# Patient Record
Sex: Female | Born: 1975 | Race: White | Hispanic: No | Marital: Married | Smoking: Former smoker
Health system: Southern US, Community
[De-identification: ages and names within clinical notes are randomized; demographics above are authoritative.]

## PROBLEM LIST (undated history)

## (undated) ENCOUNTER — Inpatient Hospital Stay (HOSPITAL_COMMUNITY): Payer: Self-pay

## (undated) DIAGNOSIS — F419 Anxiety disorder, unspecified: Secondary | ICD-10-CM

## (undated) DIAGNOSIS — M26609 Unspecified temporomandibular joint disorder, unspecified side: Secondary | ICD-10-CM

## (undated) DIAGNOSIS — O3440 Maternal care for other abnormalities of cervix, unspecified trimester: Secondary | ICD-10-CM

## (undated) DIAGNOSIS — Z9889 Other specified postprocedural states: Secondary | ICD-10-CM

## (undated) DIAGNOSIS — IMO0002 Reserved for concepts with insufficient information to code with codable children: Secondary | ICD-10-CM

## (undated) DIAGNOSIS — Q513 Bicornate uterus: Secondary | ICD-10-CM

## (undated) HISTORY — DX: Maternal care for other abnormalities of cervix, unspecified trimester: O34.40

## (undated) HISTORY — DX: Other specified postprocedural states: Z98.890

## (undated) HISTORY — DX: Unspecified temporomandibular joint disorder, unspecified side: M26.609

---

## 1998-01-31 ENCOUNTER — Other Ambulatory Visit: Admission: RE | Admit: 1998-01-31 | Discharge: 1998-01-31 | Payer: Self-pay | Admitting: Dermatology

## 1998-06-19 ENCOUNTER — Other Ambulatory Visit: Admission: RE | Admit: 1998-06-19 | Discharge: 1998-06-19 | Payer: Self-pay | Admitting: Obstetrics & Gynecology

## 1999-07-31 ENCOUNTER — Other Ambulatory Visit: Admission: RE | Admit: 1999-07-31 | Discharge: 1999-07-31 | Payer: Self-pay | Admitting: Obstetrics and Gynecology

## 2000-09-29 ENCOUNTER — Other Ambulatory Visit: Admission: RE | Admit: 2000-09-29 | Discharge: 2000-09-29 | Payer: Self-pay | Admitting: Obstetrics and Gynecology

## 2002-04-04 ENCOUNTER — Emergency Department (HOSPITAL_COMMUNITY): Admission: EM | Admit: 2002-04-04 | Discharge: 2002-04-04 | Payer: Self-pay | Admitting: Emergency Medicine

## 2004-11-28 ENCOUNTER — Encounter: Admission: RE | Admit: 2004-11-28 | Discharge: 2004-11-28 | Payer: Self-pay | Admitting: Obstetrics & Gynecology

## 2005-01-15 ENCOUNTER — Encounter: Admission: RE | Admit: 2005-01-15 | Discharge: 2005-01-15 | Payer: Self-pay | Admitting: Obstetrics & Gynecology

## 2006-11-22 IMAGING — US US TRANSVAGINAL NON-OB
1 series · 13 of 25 positions shown · non-contrast
Comparison: none

CLINICAL DATA: Right adnexal mass felt on physical exam with pelvic pain. 
TRANSABDOMINAL/TRANSVAGINAL PELVIC ULTRASOUND: 
Slight cul-de-sac free fluid is seen with ovoid central echogenic material consistent with part of mesentery measuring up to 1.5cm within the free fluid.  Alternatively focal debris such as clotted blood is possible.  Bilateral ovaries are normal in size with the left measuring 3.7cm long x 3.4cm AP x 3.9cm wide with central slightly complex cyst containing likely benign thin septation with this cyst measuring 3.2cm long x 2.7cm AP x 3.3cm wide.  The right ovary measures 2.9cm long x 1.9cm AP x 3cm wide containing a slightly complex cyst measuring up to 1.7cm.  Findings consistent with bicornuate uterus are noted with the left fundal endometrial stripe thickness measuring normally at 5mm and the right at 3mm.  The uterus is normal in size measuring 8.5cm long x 3cm AP x 5.1cm wide.

[Series 1: unknown · 0.22mm/px · 13 of 55 slices shown]
[im 1/55]
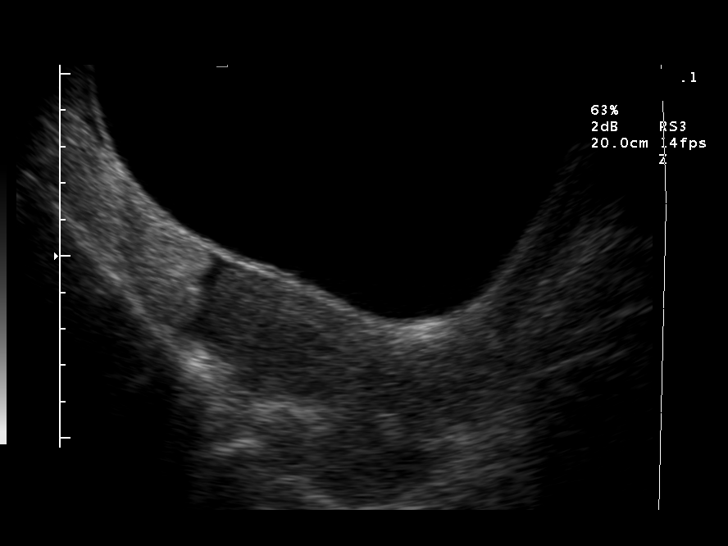
[im 5/55]
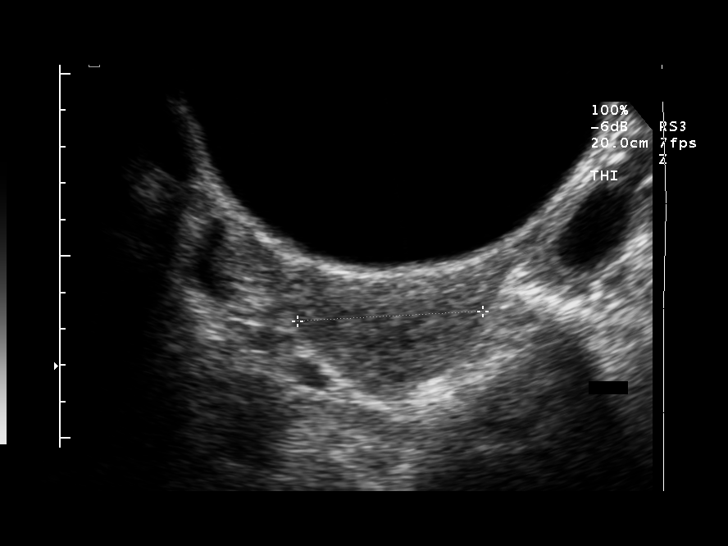
[im 10/55]
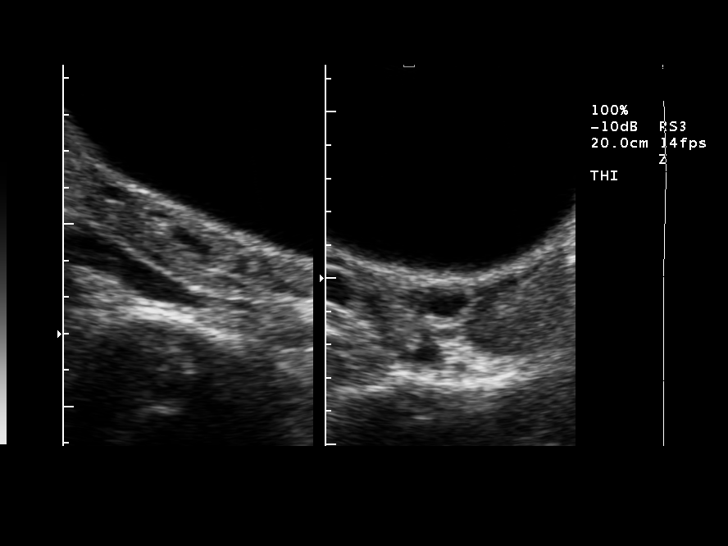
[im 14/55]
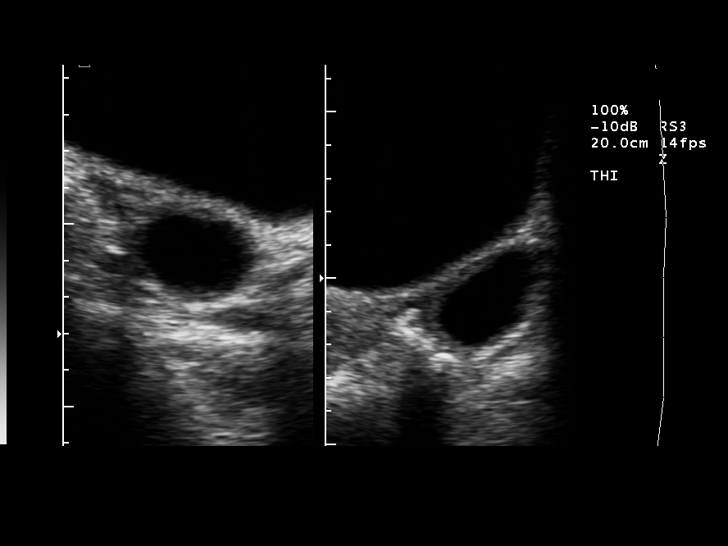
[im 19/55]
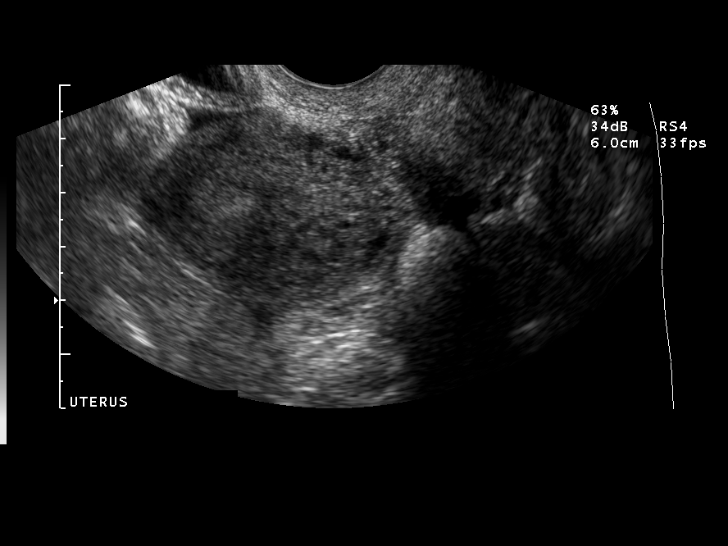
[im 23/55]
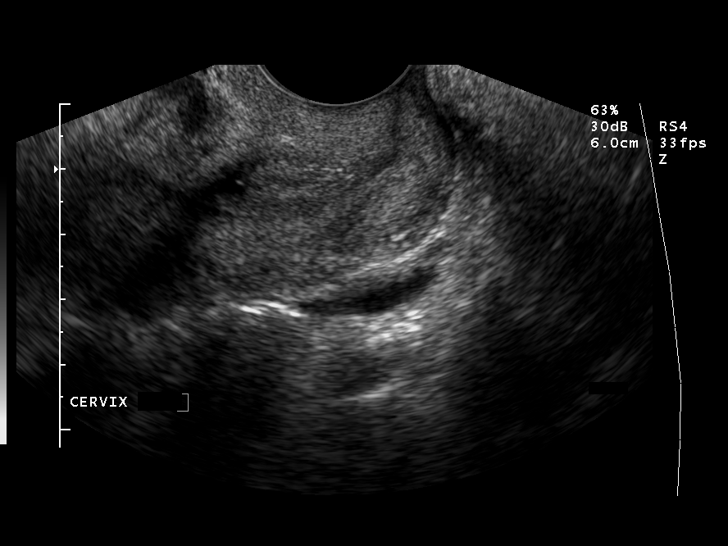
[im 28/55]
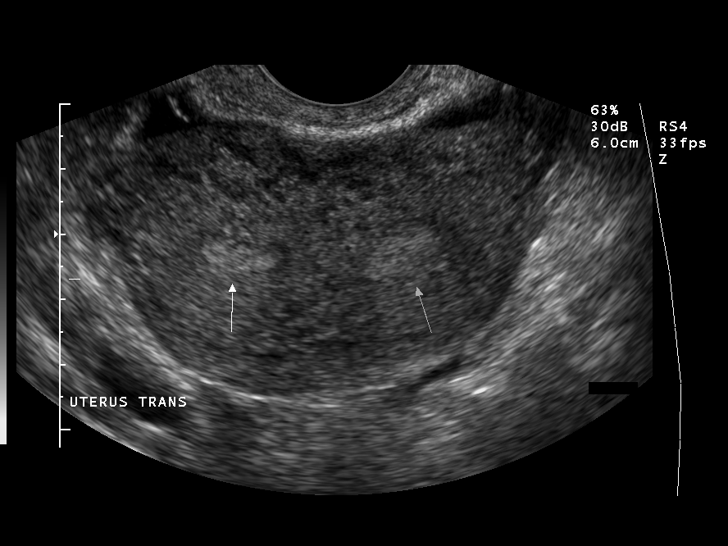
[im 32/55]
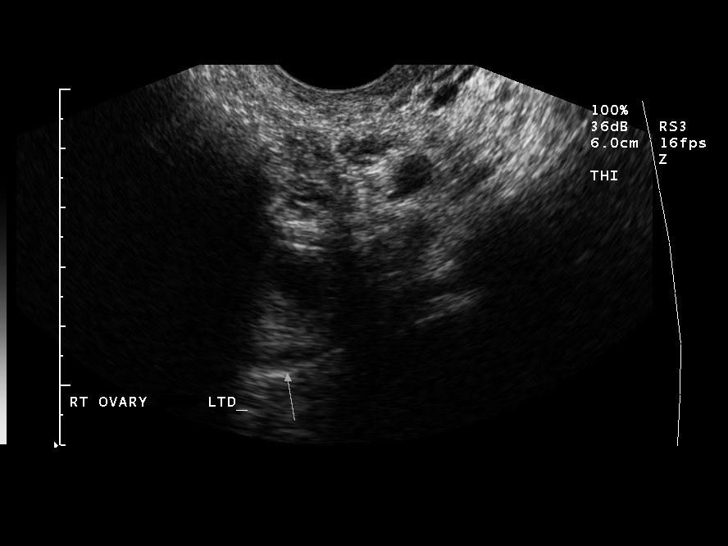
[im 37/55]
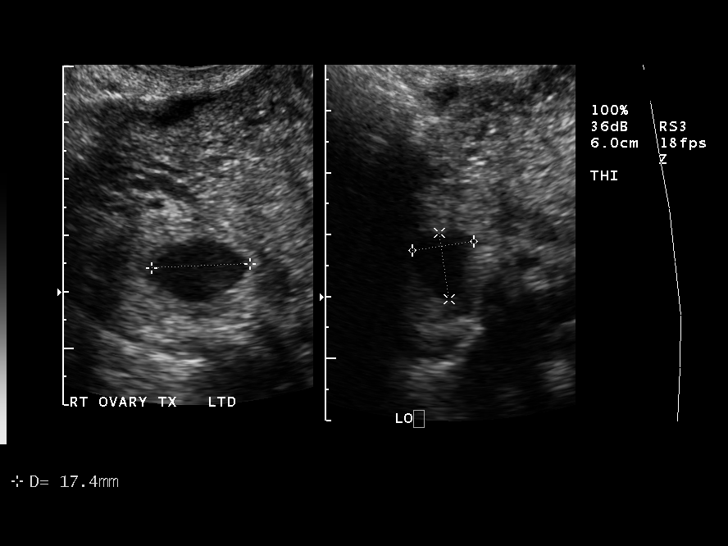
[im 41/55]
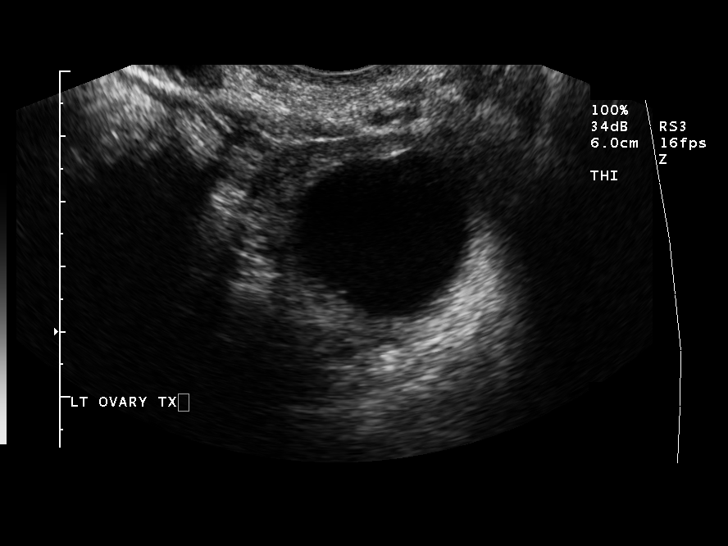
[im 46/55]
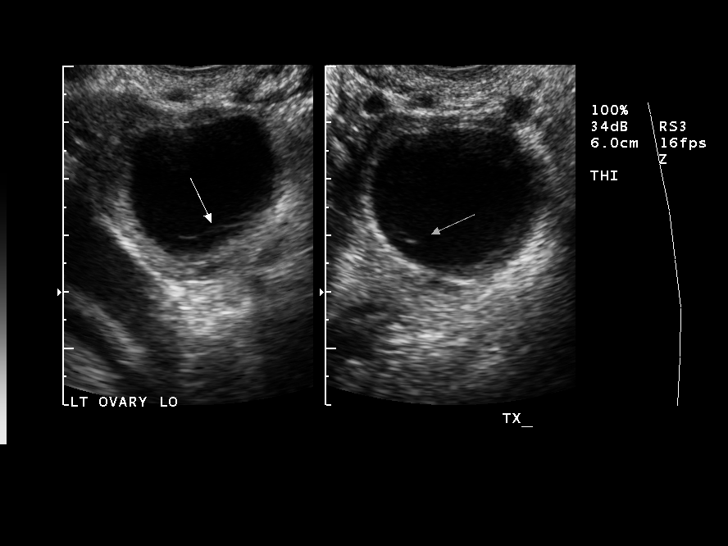
[im 50/55]
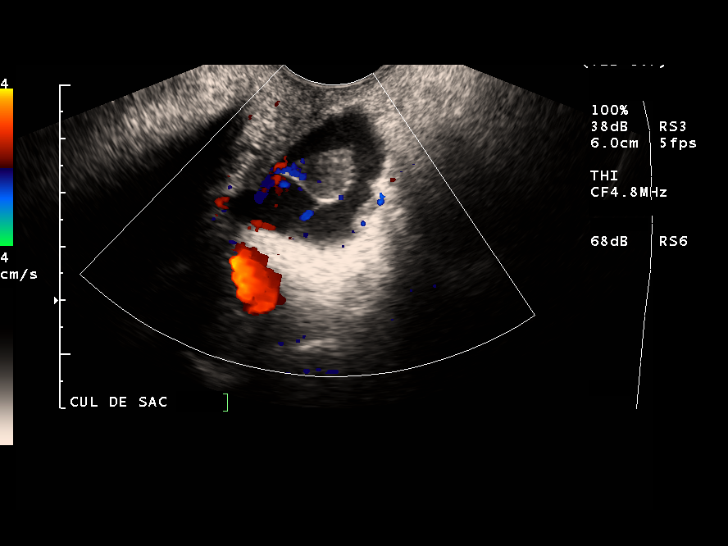
[im 55/55]
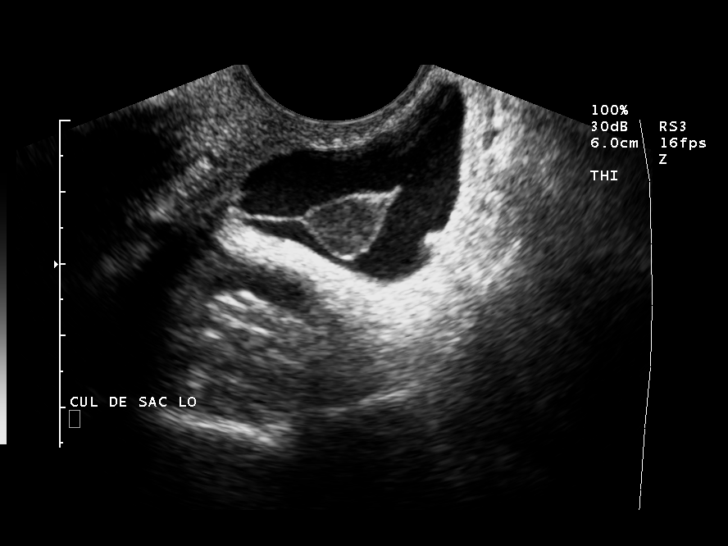

[13 of 25 positions shown; findings below may reference images not displayed]

IMPRESSION: 1.  Slight cul-de-sac free fluid suggesting ruptured or leaking ovarian cyst.  Central echogenic material is consistent with focus of mesenty or slight clotted blood. 
2.  Slightly complex bilateral ovarian cysts likely benign features with the largest at the left measuring up to 3.3cm.  
3.  Findings consistent with developmental variant bicornuate uterus. 
4.  Otherwise negative.

## 2011-06-17 LAB — RUBELLA ANTIBODY, IGM: Rubella: IMMUNE

## 2011-06-17 LAB — RPR: RPR: NONREACTIVE

## 2011-07-20 ENCOUNTER — Encounter (HOSPITAL_COMMUNITY): Payer: Self-pay | Admitting: *Deleted

## 2011-12-03 ENCOUNTER — Encounter (HOSPITAL_COMMUNITY): Payer: Self-pay | Admitting: *Deleted

## 2011-12-03 ENCOUNTER — Inpatient Hospital Stay (HOSPITAL_COMMUNITY)
Admission: AD | Admit: 2011-12-03 | Discharge: 2011-12-08 | DRG: 765 | Disposition: A | Discharge status: Home / Self Care | Payer: 59 | Source: Ambulatory Visit | Attending: Obstetrics and Gynecology | Admitting: Obstetrics and Gynecology

## 2011-12-03 ENCOUNTER — Inpatient Hospital Stay (HOSPITAL_COMMUNITY): Payer: 59

## 2011-12-03 DIAGNOSIS — O3400 Maternal care for unspecified congenital malformation of uterus, unspecified trimester: Secondary | ICD-10-CM | POA: Diagnosis present

## 2011-12-03 DIAGNOSIS — O321XX0 Maternal care for breech presentation, not applicable or unspecified: Secondary | ICD-10-CM | POA: Diagnosis present

## 2011-12-03 DIAGNOSIS — Q513 Bicornate uterus: Secondary | ICD-10-CM

## 2011-12-03 DIAGNOSIS — O99893 Other specified diseases and conditions complicating puerperium: Secondary | ICD-10-CM | POA: Diagnosis not present

## 2011-12-03 DIAGNOSIS — O4100X0 Oligohydramnios, unspecified trimester, not applicable or unspecified: Secondary | ICD-10-CM | POA: Diagnosis present

## 2011-12-03 DIAGNOSIS — O09519 Supervision of elderly primigravida, unspecified trimester: Secondary | ICD-10-CM | POA: Diagnosis present

## 2011-12-03 DIAGNOSIS — I498 Other specified cardiac arrhythmias: Secondary | ICD-10-CM | POA: Diagnosis not present

## 2011-12-03 DIAGNOSIS — O4290 Premature rupture of membranes, unspecified as to length of time between rupture and onset of labor, unspecified weeks of gestation: Principal | ICD-10-CM | POA: Diagnosis present

## 2011-12-03 HISTORY — DX: Reserved for concepts with insufficient information to code with codable children: IMO0002

## 2011-12-03 HISTORY — DX: Anxiety disorder, unspecified: F41.9

## 2011-12-03 HISTORY — DX: Bicornate uterus: Q51.3

## 2011-12-03 MED ORDER — BETAMETHASONE SOD PHOS & ACET 6 (3-3) MG/ML IJ SUSP
12.0000 mg | INTRAMUSCULAR | Status: AC
  Administered 2011-12-03 – 2011-12-04 (×2): 12 mg via INTRAMUSCULAR
  Filled 2011-12-03 (×2): qty 2

## 2011-12-03 MED ORDER — CALCIUM CARBONATE ANTACID 500 MG PO CHEW
2.0000 | CHEWABLE_TABLET | ORAL | Status: DC | PRN
  Filled 2011-12-03: qty 2

## 2011-12-03 MED ORDER — ZOLPIDEM TARTRATE 10 MG PO TABS: 10.0000 mg | ORAL_TABLET | Freq: Every evening | ORAL | Status: DC | PRN

## 2011-12-03 MED ORDER — DEXTROSE IN LACTATED RINGERS 5 % IV SOLN
INTRAVENOUS | Status: DC
  Administered 2011-12-03 – 2011-12-04 (×3): via INTRAVENOUS

## 2011-12-03 MED ORDER — DOCUSATE SODIUM 100 MG PO CAPS
100.0000 mg | ORAL_CAPSULE | Freq: Every day | ORAL | Status: DC
  Administered 2011-12-04: 100 mg via ORAL
  Filled 2011-12-03 (×2): qty 1

## 2011-12-03 MED ORDER — ACETAMINOPHEN 325 MG PO TABS: 650.0000 mg | ORAL_TABLET | ORAL | Status: DC | PRN

## 2011-12-03 MED ORDER — PRENATAL MULTIVITAMIN CH
1.0000 | ORAL_TABLET | Freq: Every day | ORAL | Status: DC
  Administered 2011-12-04: 1 via ORAL
  Filled 2011-12-03 (×2): qty 1

## 2011-12-03 MED ORDER — MAGNESIUM SULFATE BOLUS VIA INFUSION
4.0000 g | Freq: Once | INTRAVENOUS | Status: AC
  Administered 2011-12-03: 4 g via INTRAVENOUS
  Filled 2011-12-03: qty 500

## 2011-12-03 MED ORDER — TERBUTALINE SULFATE 1 MG/ML IJ SOLN
0.2500 mg | Freq: Once | INTRAMUSCULAR | Status: AC
  Administered 2011-12-03: 0.25 mg via SUBCUTANEOUS
  Filled 2011-12-03: qty 1

## 2011-12-03 MED ORDER — MAGNESIUM SULFATE 40 G IN LACTATED RINGERS - SIMPLE
2.0000 g/h | INTRAVENOUS | Status: DC
  Administered 2011-12-03 – 2011-12-04 (×2): 2 g/h via INTRAVENOUS
  Filled 2011-12-03 (×2): qty 500

## 2011-12-03 MED ORDER — AMOXICILLIN 500 MG PO CAPS
500.0000 mg | ORAL_CAPSULE | Freq: Three times a day (TID) | ORAL | Status: DC
Start: 2011-12-05 — End: 2011-12-04
  Filled 2011-12-03: qty 1

## 2011-12-03 MED ORDER — SODIUM CHLORIDE 0.9 % IV SOLN
2.0000 g | Freq: Four times a day (QID) | INTRAVENOUS | Status: DC
  Administered 2011-12-03 – 2011-12-04 (×5): 2 g via INTRAVENOUS
  Filled 2011-12-03 (×7): qty 2000

## 2011-12-03 NOTE — Progress Notes (Signed)
MgSO4 started per policy

## 2011-12-03 NOTE — Progress Notes (Signed)
Per MD check cervix and inform MD

## 2011-12-03 NOTE — Progress Notes (Signed)
[redacted]w[redacted]d   [place] report>>Breech with oligo, AC < 3 %  Reviewed BMZ, ABX + MgSo4 with pt, need for CS delivery for advanced labor

## 2011-12-03 NOTE — Consult Note (Signed)
Requested to meet with this set of parents in anticipation of delivery of preterm infant in near future. Membranes are ruptured and there is at present oligohydramnios. Singleton pregnancy with fetus in breech position and mother by report with a bicornuate uterus.  Currently receiving tocolysis with BMZ first dose today.  By report fetus is 32+ weeks gestation but measuring ~ 2 weeks less.  First pregnancy.    Spoke to parents in mother's room - both seemed comfortable .  Reviewed the most common association with prematurity, e.g. Preterm lung immaturity, and reviewed ;'surfactant' as the fatty protein that the BMZ was provoking to be produced in greater supply. I alluded to the smaller size of the fetus as a possible reflection of the bicornuate uterus or from whatever process, likely to be providing some subclinical stress that could mature the lungs also.  Discussed Neonatal physicians being present in house 24/7 and ready to be present whenever called.  Natural history reviewed of first several days with or without breathing distress and the point at which enteral feedings might be considered. Mother desires to breast feed but acted as if having a C/S might work against this plan. Reassured about this and mentioned that infant would receive amino acid and free fatty acids, e.g a balanced diet iv until enteral feedings were sufficient. Also mentioned in passing the role of cranial ultrasound if indicated by the course followed by the infant and echocardiogram as a possibility during the time that the lungs were nearing the point of greater maturation. Antibiotics as a possibility were also mentioned and explained as in some circumstances being provided as a precaution were there any signs of occult inflammatory processes. Lastly I held out hope that infant would be stable following birth and would be going to NICU for observation and maintenance in a temperature controlled environment.   Parents have  extended family in town and can be expected to have sufficient extended support emotionally.   Dagoberto Ligas MD [facility]

## 2011-12-03 NOTE — H&P (Signed)
NAMEMarland Kitchen  [name], [name] NO.:  [id number]  MEDICAL RECORD NO.:  [id number]  LOCATION:                                 FACILITY:  PHYSICIAN:  Duke Salvia. Marcelle Overlie, M.D.DATE OF BIRTH:  [birth year]  DATE OF ADMISSION:  12/03/2011 DATE OF DISCHARGE:                             HISTORY & PHYSICAL   CHIEF COMPLAINT:  Rupture of membranes, early labor.  HISTORY OF PRESENT ILLNESS:  A 36 year old, G1, P0, EDD April 15, seen in MAU today with a complaint of a sudden gush of fluid and having some irregular contractions.  When last seen in the office was breech by Leopold's.  Cervix was 1 cm and closed that was on November 27, 2011. In MAU, she had obvious rupture of membranes with some irregular contractions noted and will be admitted for further evaluation.  Last ultrasound October 28, 2011 in our office was noted to be breech. Cervix was 4.6 cm with no funneling.  PAST MEDICAL HISTORY:  Please see the [place] form for details.  Noted to have a bicornuate uterus, a history of LEEP in 2008.  PHYSICAL EXAMINATION:  VITAL SIGNS:  Temperature 98.2, blood pressure 120/78. HEENT:  Unremarkable. NECK:  Supple without masses. LUNGS:  Clear. CARDIOVASCULAR:  Regular rate and rhythm without murmurs, rubs, or gallops. BREASTS:  Without masses. ABDOMEN:  A fundal height of 30 cm.  Fetal heart rate 140. CERVIX:  Showed clear fluid.  Fern and nitrazine positive.  It was felt to be long and closed by palpation.  Ultrasound is pending at this dictation.  IMPRESSION: 1. 32-week gestation, preterm premature rupture of membranes. 2. History of bicornuate uterus, prior LEEP.  PLAN:  We will admit for culture, antibiotics, betamethasone series.  We will obtain ultrasound to confirm presentation, AFI of 7, discussed cesarean section for breech if advanced labor.    Berthe Oley M. Marcelle Overlie, M.D.    RMH/MEDQ  D:  12/03/2011  T:  12/03/2011  Job:  [id number]

## 2011-12-03 NOTE — H&P (Signed)
  NEVELYN MELLOTT  DICTATION # [id number] CSN# [id number]   Meriel Pica, MD 12/03/2011 12:32 PM

## 2011-12-03 NOTE — ED Provider Notes (Signed)
History   Pt presents today c/o SROM. She states she was standing at work and she noticed a "gush" of fluid. Shortly after she began to have abd cramping. She then came straight to the MAU. She denies recent intercourse, vag bleeding, fever, dysuria, or any other sx at this time.  No chief complaint on file.  HPI  OB History    Grav Para Term Preterm Abortions TAB SAB Ect Mult Living   1               Past Medical History  Diagnosis Date  . Bicornate uterus     Past Surgical History  Procedure Date  . No past surgeries     No family history on file.  History  Substance Use Topics  . Smoking status: Former Smoker    Quit date: 10/12/1996  . Smokeless tobacco: Not on file  . Alcohol Use: No    Allergies: Allergies not on file  No prescriptions prior to admission    Review of Systems  Constitutional: Negative for fever.  Eyes: Negative for blurred vision and double vision.  Respiratory: Negative for cough, hemoptysis, sputum production, shortness of breath and wheezing.   Cardiovascular: Negative for chest pain and palpitations.  Gastrointestinal: Positive for diarrhea. Negative for nausea, vomiting and constipation.  Genitourinary: Negative for dysuria, urgency, frequency and hematuria.  Neurological: Negative for dizziness and headaches.  Psychiatric/Behavioral: Negative for depression and suicidal ideas.   Physical Exam   Blood pressure 134/84, pulse 100, temperature 97.4 F (36.3 C), temperature source Oral, resp. rate 18, height 5\' 10"  (1.778 m), weight 134 lb (60.782 kg).  Physical Exam  Nursing note and vitals reviewed. Constitutional: She is oriented to person, place, and time. She appears well-developed and well-nourished. No distress.  HENT:  Head: Normocephalic and atraumatic.  Eyes: EOM are normal. Pupils are equal, round, and reactive to light.  GI: Soft. She exhibits no distension. There is no tenderness. There is no rebound and no guarding.    Genitourinary: No bleeding around the vagina. Vaginal discharge found.       Cervix Cl/90/0. Large amount of clear fluid in vag vault.  Neurological: She is alert and oriented to person, place, and time.  Skin: Skin is warm and dry. She is not diaphoretic.  Psychiatric: She has a normal mood and affect. Her behavior is normal. Judgment and thought content normal.    MAU Course  Procedures  Fern test positive.  NST reactive with irregular ctx.  Discussed pt with Dr. Marcelle Overlie. Will admit. Dr. Marcelle Overlie is putting in orders.  Assessment and Plan  PPROM: admit.  Clinton Gallant. Louretta Tantillo III, DrHSc, MPAS, PA-C  12/03/2011, 12:15 PM   Henrietta Hoover, PA 12/03/11 1221

## 2011-12-04 ENCOUNTER — Encounter (HOSPITAL_COMMUNITY): Payer: Self-pay | Admitting: Anesthesiology

## 2011-12-04 ENCOUNTER — Inpatient Hospital Stay (HOSPITAL_COMMUNITY): Payer: 59 | Admitting: Anesthesiology

## 2011-12-04 ENCOUNTER — Encounter (HOSPITAL_COMMUNITY)
Admission: AD | Disposition: A | Discharge status: Home / Self Care | Payer: Self-pay | Source: Ambulatory Visit | Attending: Obstetrics and Gynecology

## 2011-12-04 ENCOUNTER — Encounter (HOSPITAL_COMMUNITY): Payer: Self-pay | Admitting: *Deleted

## 2011-12-04 LAB — CORD BLOOD GAS (ARTERIAL)
Bicarbonate: 24.1 mEq/L — ABNORMAL HIGH (ref 20.0–24.0)
TCO2: 25.2 mmol/L (ref 0–100)
pO2 cord blood: 55.6 mmHg

## 2011-12-04 SURGERY — Surgical Case
Anesthesia: General | Wound class: Clean Contaminated

## 2011-12-04 MED ORDER — SUCCINYLCHOLINE CHLORIDE 20 MG/ML IJ SOLN
INTRAMUSCULAR | Status: DC | PRN
  Administered 2011-12-04: 200 mg via INTRAVENOUS

## 2011-12-04 MED ORDER — KETOROLAC TROMETHAMINE 30 MG/ML IJ SOLN
INTRAMUSCULAR | Status: AC
  Filled 2011-12-04: qty 1

## 2011-12-04 MED ORDER — BISACODYL 10 MG RE SUPP: 10.0000 mg | Freq: Every day | RECTAL | Status: DC | PRN

## 2011-12-04 MED ORDER — LACTATED RINGERS IV SOLN
INTRAVENOUS | Status: DC | PRN
  Administered 2011-12-04: 18:00:00 via INTRAVENOUS

## 2011-12-04 MED ORDER — OXYTOCIN 10 UNIT/ML IJ SOLN
INTRAMUSCULAR | Status: AC
  Filled 2011-12-04: qty 2

## 2011-12-04 MED ORDER — NALOXONE HCL 0.4 MG/ML IJ SOLN: 0.4000 mg | INTRAMUSCULAR | Status: DC | PRN

## 2011-12-04 MED ORDER — OXYCODONE-ACETAMINOPHEN 5-325 MG PO TABS
1.0000 | ORAL_TABLET | ORAL | Status: DC | PRN
  Administered 2011-12-05: 1 via ORAL
  Filled 2011-12-04: qty 1

## 2011-12-04 MED ORDER — ZOLPIDEM TARTRATE 5 MG PO TABS: 5.0000 mg | ORAL_TABLET | Freq: Every evening | ORAL | Status: DC | PRN

## 2011-12-04 MED ORDER — MENTHOL 3 MG MT LOZG: 1.0000 | LOZENGE | OROMUCOSAL | Status: DC | PRN

## 2011-12-04 MED ORDER — MEDROXYPROGESTERONE ACETATE 150 MG/ML IM SUSP: 150.0000 mg | INTRAMUSCULAR | Status: DC | PRN

## 2011-12-04 MED ORDER — LANOLIN HYDROUS EX OINT: 1.0000 "application " | TOPICAL_OINTMENT | CUTANEOUS | Status: DC | PRN

## 2011-12-04 MED ORDER — ONDANSETRON HCL 4 MG/2ML IJ SOLN: 4.0000 mg | INTRAMUSCULAR | Status: DC | PRN

## 2011-12-04 MED ORDER — PHENYLEPHRINE 40 MCG/ML (10ML) SYRINGE FOR IV PUSH (FOR BLOOD PRESSURE SUPPORT)
PREFILLED_SYRINGE | INTRAVENOUS | Status: AC
  Filled 2011-12-04: qty 5

## 2011-12-04 MED ORDER — TETANUS-DIPHTH-ACELL PERTUSSIS 5-2.5-18.5 LF-MCG/0.5 IM SUSP
0.5000 mL | Freq: Once | INTRAMUSCULAR | Status: AC
  Administered 2011-12-05: 0.5 mL via INTRAMUSCULAR
  Filled 2011-12-04: qty 0.5

## 2011-12-04 MED ORDER — PRENATAL MULTIVITAMIN CH
1.0000 | ORAL_TABLET | Freq: Every day | ORAL | Status: DC
  Administered 2011-12-05 – 2011-12-08 (×4): 1 via ORAL
  Filled 2011-12-04 (×4): qty 1

## 2011-12-04 MED ORDER — SENNOSIDES-DOCUSATE SODIUM 8.6-50 MG PO TABS
2.0000 | ORAL_TABLET | Freq: Every day | ORAL | Status: DC
  Administered 2011-12-05 – 2011-12-07 (×3): 2 via ORAL

## 2011-12-04 MED ORDER — ONDANSETRON HCL 4 MG/2ML IJ SOLN
INTRAMUSCULAR | Status: DC | PRN
  Administered 2011-12-04: 4 mg via INTRAVENOUS

## 2011-12-04 MED ORDER — NITROGLYCERIN 0.4 MG/SPRAY TL SOLN
Status: AC
  Filled 2011-12-04: qty 4.9

## 2011-12-04 MED ORDER — PHENYLEPHRINE HCL 10 MG/ML IJ SOLN
INTRAMUSCULAR | Status: DC | PRN
  Administered 2011-12-04 (×2): .04 mg via INTRAVENOUS

## 2011-12-04 MED ORDER — SODIUM CHLORIDE 0.9 % IJ SOLN: 9.0000 mL | INTRAMUSCULAR | Status: DC | PRN

## 2011-12-04 MED ORDER — HYDROMORPHONE HCL PF 1 MG/ML IJ SOLN
INTRAMUSCULAR | Status: AC
  Filled 2011-12-04: qty 1

## 2011-12-04 MED ORDER — OXYTOCIN 20 UNITS IN LACTATED RINGERS INFUSION - SIMPLE
INTRAVENOUS | Status: DC | PRN
  Administered 2011-12-04: 20 [IU] via INTRAVENOUS

## 2011-12-04 MED ORDER — BUTORPHANOL TARTRATE 2 MG/ML IJ SOLN
1.0000 mg | Freq: Once | INTRAMUSCULAR | Status: AC
  Administered 2011-12-04: 1 mg via INTRAVENOUS
  Filled 2011-12-04: qty 1

## 2011-12-04 MED ORDER — PROMETHAZINE HCL 25 MG/ML IJ SOLN: 6.2500 mg | INTRAMUSCULAR | Status: DC | PRN

## 2011-12-04 MED ORDER — WITCH HAZEL-GLYCERIN EX PADS: 1.0000 "application " | MEDICATED_PAD | CUTANEOUS | Status: DC | PRN

## 2011-12-04 MED ORDER — HYDROMORPHONE 0.3 MG/ML IV SOLN
INTRAVENOUS | Status: AC
  Filled 2011-12-04: qty 25

## 2011-12-04 MED ORDER — LACTATED RINGERS IV SOLN
INTRAVENOUS | Status: DC
  Administered 2011-12-05: 06:00:00 via INTRAVENOUS

## 2011-12-04 MED ORDER — HYDROMORPHONE 0.3 MG/ML IV SOLN
INTRAVENOUS | Status: DC
  Administered 2011-12-04: 23:00:00 via INTRAVENOUS
  Administered 2011-12-05: 0.4 mg via INTRAVENOUS
  Administered 2011-12-05: 0.999 mg via INTRAVENOUS

## 2011-12-04 MED ORDER — ONDANSETRON HCL 4 MG/2ML IJ SOLN: 4.0000 mg | Freq: Four times a day (QID) | INTRAMUSCULAR | Status: DC | PRN

## 2011-12-04 MED ORDER — PROPOFOL 10 MG/ML IV EMUL
INTRAVENOUS | Status: DC | PRN
  Administered 2011-12-04: 180 mg via INTRAVENOUS

## 2011-12-04 MED ORDER — MIDAZOLAM HCL 5 MG/5ML IJ SOLN
INTRAMUSCULAR | Status: DC | PRN
  Administered 2011-12-04: 2 mg via INTRAVENOUS

## 2011-12-04 MED ORDER — MIDAZOLAM HCL 2 MG/2ML IJ SOLN
INTRAMUSCULAR | Status: AC
  Filled 2011-12-04: qty 2

## 2011-12-04 MED ORDER — FENTANYL CITRATE 0.05 MG/ML IJ SOLN
INTRAMUSCULAR | Status: AC
  Filled 2011-12-04: qty 5

## 2011-12-04 MED ORDER — MEASLES, MUMPS & RUBELLA VAC ~~LOC~~ INJ: 0.5000 mL | INJECTION | Freq: Once | SUBCUTANEOUS | Status: DC

## 2011-12-04 MED ORDER — BUPIVACAINE HCL (PF) 0.25 % IJ SOLN
INTRAMUSCULAR | Status: DC | PRN
  Administered 2011-12-04: 10 mL

## 2011-12-04 MED ORDER — FENTANYL CITRATE 0.05 MG/ML IJ SOLN
INTRAMUSCULAR | Status: DC | PRN
  Administered 2011-12-04: 250 ug via INTRAVENOUS

## 2011-12-04 MED ORDER — SUCCINYLCHOLINE CHLORIDE 20 MG/ML IJ SOLN
INTRAMUSCULAR | Status: AC
  Filled 2011-12-04: qty 10

## 2011-12-04 MED ORDER — DEXTROSE IN LACTATED RINGERS 5 % IV SOLN
INTRAVENOUS | Status: DC | PRN
  Administered 2011-12-04: 18:00:00 via INTRAVENOUS

## 2011-12-04 MED ORDER — ONDANSETRON HCL 4 MG/2ML IJ SOLN
INTRAMUSCULAR | Status: AC
  Filled 2011-12-04: qty 2

## 2011-12-04 MED ORDER — OXYTOCIN 20 UNITS IN LACTATED RINGERS INFUSION - SIMPLE
INTRAVENOUS | Status: AC
  Filled 2011-12-04: qty 1000

## 2011-12-04 MED ORDER — CEFAZOLIN SODIUM 1-5 GM-% IV SOLN
INTRAVENOUS | Status: AC
  Filled 2011-12-04: qty 50

## 2011-12-04 MED ORDER — DIPHENHYDRAMINE HCL 25 MG PO CAPS: 25.0000 mg | ORAL_CAPSULE | Freq: Four times a day (QID) | ORAL | Status: DC | PRN

## 2011-12-04 MED ORDER — DIBUCAINE 1 % RE OINT
1.0000 | TOPICAL_OINTMENT | RECTAL | Status: DC | PRN
Start: 2011-12-04 — End: 2011-12-08

## 2011-12-04 MED ORDER — DIPHENHYDRAMINE HCL 50 MG/ML IJ SOLN: 12.5000 mg | Freq: Four times a day (QID) | INTRAMUSCULAR | Status: DC | PRN

## 2011-12-04 MED ORDER — DIPHENHYDRAMINE HCL 12.5 MG/5ML PO ELIX
12.5000 mg | ORAL_SOLUTION | Freq: Four times a day (QID) | ORAL | Status: DC | PRN
  Filled 2011-12-04: qty 5

## 2011-12-04 MED ORDER — PROPOFOL 10 MG/ML IV EMUL
INTRAVENOUS | Status: AC
  Filled 2011-12-04: qty 20

## 2011-12-04 MED ORDER — FLEET ENEMA 7-19 GM/118ML RE ENEM: 1.0000 | ENEMA | Freq: Every day | RECTAL | Status: DC | PRN

## 2011-12-04 MED ORDER — HYDROMORPHONE HCL PF 1 MG/ML IJ SOLN
0.2500 mg | INTRAMUSCULAR | Status: DC | PRN
  Administered 2011-12-04 (×4): 0.5 mg via INTRAVENOUS

## 2011-12-04 MED ORDER — SIMETHICONE 80 MG PO CHEW
80.0000 mg | CHEWABLE_TABLET | Freq: Three times a day (TID) | ORAL | Status: DC
  Administered 2011-12-05 – 2011-12-07 (×12): 80 mg via ORAL

## 2011-12-04 MED ORDER — SIMETHICONE 80 MG PO CHEW: 80.0000 mg | CHEWABLE_TABLET | ORAL | Status: DC | PRN

## 2011-12-04 MED ORDER — OXYTOCIN 20 UNITS IN LACTATED RINGERS INFUSION - SIMPLE
125.0000 mL/h | INTRAVENOUS | Status: AC
  Administered 2011-12-04: 125 mL/h via INTRAVENOUS

## 2011-12-04 MED ORDER — ACETAMINOPHEN 325 MG PO TABS: 325.0000 mg | ORAL_TABLET | ORAL | Status: DC | PRN

## 2011-12-04 MED ORDER — KETOROLAC TROMETHAMINE 30 MG/ML IJ SOLN
15.0000 mg | Freq: Once | INTRAMUSCULAR | Status: AC | PRN
  Administered 2011-12-04: 30 mg via INTRAVENOUS

## 2011-12-04 MED ORDER — ONDANSETRON HCL 4 MG PO TABS: 4.0000 mg | ORAL_TABLET | ORAL | Status: DC | PRN

## 2011-12-04 MED ORDER — IBUPROFEN 600 MG PO TABS
600.0000 mg | ORAL_TABLET | Freq: Four times a day (QID) | ORAL | Status: DC
  Administered 2011-12-05 – 2011-12-08 (×12): 600 mg via ORAL
  Filled 2011-12-04 (×12): qty 1

## 2011-12-04 SURGICAL SUPPLY — 31 items
ADH SKN CLS APL DERMABOND .7 (GAUZE/BANDAGES/DRESSINGS) ×1
BARRIER ADHS 3X4 INTERCEED (GAUZE/BANDAGES/DRESSINGS) IMPLANT
BRR ADH 4X3 ABS CNTRL BYND (GAUZE/BANDAGES/DRESSINGS)
CHLORAPREP W/TINT 26ML (MISCELLANEOUS) ×2 IMPLANT
CLOTH BEACON ORANGE TIMEOUT ST (SAFETY) ×2 IMPLANT
CONTAINER PREFILL 10% NBF 15ML (MISCELLANEOUS) IMPLANT
DERMABOND ADVANCED (GAUZE/BANDAGES/DRESSINGS) ×1
DERMABOND ADVANCED .7 DNX12 (GAUZE/BANDAGES/DRESSINGS) IMPLANT
ELECT REM PT RETURN 9FT ADLT (ELECTROSURGICAL) ×2
ELECTRODE REM PT RTRN 9FT ADLT (ELECTROSURGICAL) ×1 IMPLANT
EXTRACTOR VACUUM M CUP 4 TUBE (SUCTIONS) IMPLANT
GLOVE BIO SURGEON STRL SZ 6.5 (GLOVE) ×4 IMPLANT
GOWN PREVENTION PLUS LG XLONG (DISPOSABLE) ×6 IMPLANT
KIT ABG SYR 3ML LUER SLIP (SYRINGE) IMPLANT
NDL HYPO 25X5/8 SAFETYGLIDE (NEEDLE) ×1 IMPLANT
NEEDLE HYPO 22GX1.5 SAFETY (NEEDLE) ×2 IMPLANT
NEEDLE HYPO 25X5/8 SAFETYGLIDE (NEEDLE) ×2 IMPLANT
NS IRRIG 1000ML POUR BTL (IV SOLUTION) ×2 IMPLANT
PACK C SECTION WH (CUSTOM PROCEDURE TRAY) ×2 IMPLANT
SLEEVE SCD COMPRESS KNEE MED (MISCELLANEOUS) IMPLANT
STAPLER VISISTAT 35W (STAPLE) IMPLANT
SUT CHROMIC 0 CTX 36 (SUTURE) ×4 IMPLANT
SUT PLAIN 0 NONE (SUTURE) IMPLANT
SUT PLAIN 2 0 XLH (SUTURE) IMPLANT
SUT VIC AB 0 CT1 27 (SUTURE) ×6
SUT VIC AB 0 CT1 27XBRD ANBCTR (SUTURE) ×3 IMPLANT
SUT VIC AB 4-0 KS 27 (SUTURE) IMPLANT
SYR CONTROL 10ML LL (SYRINGE) ×2 IMPLANT
TOWEL OR 17X24 6PK STRL BLUE (TOWEL DISPOSABLE) ×4 IMPLANT
TRAY FOLEY CATH 14FR (SET/KITS/TRAYS/PACK) ×2 IMPLANT
WATER STERILE IRR 1000ML POUR (IV SOLUTION) ×2 IMPLANT

## 2011-12-04 NOTE — Progress Notes (Signed)
Nurse reported that pt had come in yesterday and that she had been feeling anxious about the baby.  I visited with pt and her mother.  She was in good spirits and said that she was feeling much more calm today now that it seemed that the baby was doing okay.  She had not expected anything like this to happen and had been taken by surprise yesterday.  She was appreciative of all the support from staff and she appreciated hearing that what she was going through is something that we see in the hospital regularly.  She has good family support and she is hopeful that she will be able to use this time to relax and focus herself before the baby arrives.  Please page as needed or as pt requests for emotional and spiritual support. [phone]  Chaplain Dyanne Carrel 12:19 PM   12/04/11 1200  Clinical Encounter Type  Visited With Patient and family together  Visit Type Initial  Referral From Nurse  Spiritual Encounters  Spiritual Needs Emotional

## 2011-12-04 NOTE — Transfer of Care (Signed)
Immediate Anesthesia Transfer of Care Note  Patient: [name] Walker  Procedure(s) Performed: Procedure(s) (LRB): CESAREAN SECTION (N/A)  Patient Location: PACU  Anesthesia Type: General  Level of Consciousness: alert  and oriented  Airway & Oxygen Therapy: Patient Spontanous Breathing  Post-op Assessment: Report given to PACU RN and Post -op Vital signs reviewed and stable  Post vital signs: stable  Complications: No apparent anesthesia complications

## 2011-12-04 NOTE — Anesthesia Postprocedure Evaluation (Signed)
Anesthesia Post Note  Patient: [name] Walker  Procedure(s) Performed: Procedure(s) (LRB): CESAREAN SECTION (N/A)  Anesthesia type: GA  Patient location: PACU  Post pain: Pain level controlled  Post assessment: Post-op Vital signs reviewed  Last Vitals:  Filed Vitals:   12/04/11 1915  BP: 121/78  Pulse: 82  Temp: 36.7 C  Resp: 14    Post vital signs: Reviewed  Level of consciousness: sedated  Complications: No apparent anesthesia complications

## 2011-12-04 NOTE — Anesthesia Preprocedure Evaluation (Signed)
Anesthesia Evaluation  Patient identified by MRN, date of birth, ID band Patient awake    Reviewed: Allergy & Precautions, H&P , NPO status , Patient's Chart, lab work & pertinent test results  Airway Mallampati: II TM Distance: >3 FB Neck ROM: Full    Dental No notable dental hx. (+) Teeth Intact   Pulmonary neg pulmonary ROS,  clear to auscultation  Pulmonary exam normal       Cardiovascular neg cardio ROS Regular Normal    Neuro/Psych Negative Neurological ROS  Negative Psych ROS   GI/Hepatic negative GI ROS, Neg liver ROS,   Endo/Other  Negative Endocrine ROS  Renal/GU negative Renal ROS  Genitourinary negative   Musculoskeletal negative musculoskeletal ROS (+)   Abdominal Normal abdominal exam  (+)   Peds  Hematology negative hematology ROS (+)   Anesthesia Other Findings   Reproductive/Obstetrics negative OB ROS (+) Pregnancy                           Anesthesia Physical Anesthesia Plan  ASA: II and Emergent  Anesthesia Plan: General ETT, Cricoid Pressure and Rapid Sequence   Post-op Pain Management:    Induction:   Airway Management Planned:   Additional Equipment:   Intra-op Plan:   Post-operative Plan:   Informed Consent: I have reviewed the patients History and Physical, chart, labs and discussed the procedure including the risks, benefits and alternatives for the proposed anesthesia with the patient or authorized representative who has indicated his/her understanding and acceptance.   Dental Advisory Given  Plan Discussed with: Anesthesiologist, CRNA and Surgeon  Anesthesia Plan Comments:         Anesthesia Quick Evaluation

## 2011-12-04 NOTE — Progress Notes (Signed)
Patient is resting comfortably. Afebrile Vital signs stable General alert and oriented Abdomen is soft and non tender  IMPRESSION: IUP at 32 w 4 days PPROM BIcornuate Uterus  PLAN: Continue bedrest Continue antibiotics and magnesium Complete steroid series Breech - c section for delivery

## 2011-12-04 NOTE — Consult Note (Signed)
Neonatology Note:   Attendance at C-section:    I was asked to attend this urgent primary C/S at 32 4/[redacted] weeks GA due to breech presentation and rapid dilatation. The mother is a G1P0 O pos, GBS unknown with bicornuate uterus and PROM about 32 hours prior to delivery. She was admitted yesterday and treated with magnesium sulfate, betamethasone (second dose given shortly prior to delivery), and q 6 hour Ampicillin. She was having some variable FHR decelerations today and a prolonged one late this afternoon; she had sudden onset of lower abdominal pain and, on exam, the baby's bottom could be felt in the vagina, so an emergency C/S was done under general anesthesia. Baby was frank breech. The uterus tightened around the head, necessitating extension of the uterine incision to get the baby's head delivered. Infant was initially floppy and apneic, but after we bulb suctioned some thick mucous from the throat and gave vigorous stimulation, she began to cry vigorously, with very rapid improvement in color and normalization of tone. The baby remained pink in room air after that and was transported to the NICU in room air, still crying frequently. Her father was in attendance.  Ap 7/9.    Deatra James, MD

## 2011-12-04 NOTE — Progress Notes (Signed)
After pt returning from the bathroom, pt having severe abd pain lasting a couple minutes, abd noted to be very hard, pt denies any vaginal bleeding after voiding.  FHR returning to baseline.  RN will continue to monitor FHR

## 2011-12-04 NOTE — Progress Notes (Signed)
UR Chart review completed.  

## 2011-12-04 NOTE — Brief Op Note (Signed)
12/03/2011 - 12/04/2011  6:56 PM  PATIENT:  [name] Walker  36 y.o. female  PRE-OPERATIVE DIAGNOSIS:  PPROM at 67 w 4 day, Breech presentation   POST-OPERATIVE DIAGNOSIS:  PPROM at 69 w 4 days, Breech presentation, bicornuate uterus  PROCEDURE:  Procedure(s) (LRB): CESAREAN SECTION (N/A)  SURGEON:  Surgeon(s) and Role:    * Jeani Hawking, MD - Primary  PHYSICIAN ASSISTANT:   ASSISTANTS: none   ANESTHESIA:   general  EBL:  Total I/O In: 2690 [P.O.:1140; I.V.:1500; IV Piggyback:50] Out: 2600 [Urine:1900; Blood:700]  BLOOD ADMINISTERED:none  DRAINS: Urinary Catheter (Foley)   LOCAL MEDICATIONS USED:  XYLOCAINE   SPECIMEN:  No Specimen and Source of Specimen:  placenta  DISPOSITION OF SPECIMEN:  PATHOLOGY  COUNTS:  YES  TOURNIQUET:  * No tourniquets in log *  DICTATION: .Other Dictation: Dictation Number [id number]  PLAN OF CARE: Admit to inpatient   PATIENT DISPOSITION:  PACU - hemodynamically stable.   Delay start of Pharmacological VTE agent (>24hrs) due to surgical blood loss or risk of bleeding: yes

## 2011-12-05 ENCOUNTER — Other Ambulatory Visit: Payer: Self-pay | Admitting: Obstetrics and Gynecology

## 2011-12-05 LAB — ABO/RH: ABO/RH(D): O POS

## 2011-12-05 LAB — CBC
MCV: 95.4 fL (ref 78.0–100.0)
Platelets: 190 10*3/uL (ref 150–400)
RBC: 3.05 MIL/uL — ABNORMAL LOW (ref 3.87–5.11)
RDW: 12.8 % (ref 11.5–15.5)
WBC: 22.4 10*3/uL — ABNORMAL HIGH (ref 4.0–10.5)

## 2011-12-05 NOTE — Op Note (Signed)
NAMEMarland Walker  CHAVIE, KOLINSKI            ACCOUNT NO.:  [id number]  MEDICAL RECORD NO.:  [id number]  LOCATION:  9317                          FACILITY:  [facility]  PHYSICIAN:  Kennet Mccort L. Jayveion Stalling, M.D.DATE OF BIRTH:  26-Dec-1975  DATE OF PROCEDURE: DATE OF DISCHARGE:                              OPERATIVE REPORT   PREOPERATIVE DIAGNOSES: 1. Intrauterine pregnancy at 32 weeks and 4 days 2. Preterm premature rupture of membranes. 3. Bicornuate uterus. 4. Breech presentation. 5. Advanced cervical dilation.  POSTOPERATIVE DIAGNOSES: 1. Intrauterine pregnancy at 32 weeks and 4 days 2. Preterm premature rupture of membranes. 3. Bicornuate uterus. 4. Breech presentation. 5. Advanced cervical dilation.  PROCEDURE:  Primary low transverse cesarean section.  SURGEON:  Leia Coletti L. Vincente Poli, MD  ANESTHESIA:  General.  EBL:  500 mL.  COMPLICATIONS:  None.  PROCEDURE IN DETAIL:  The patient who had presented yesterday to the hospital with PPROM.  She was on the antenatal unit and started her steroid series and antibiotics.  She was also on magnesium.  I was called to evaluate the patient because she had complained of increased rectal pressure and there was a deceleration that occurred, that did resolve after the patient gone up to the bathroom.  I was in the hospital, so I went down to evaluate the patient.  When I went in to evaluate the patient, the patient was lying on her side and appeared to be very uncomfortable.  At that point, I felt cervical exam was necessary.  When I examined her, I noted that she was complete, complete, +3 breech with a foot presenting.  The patient was then urgently taken for a stat cesarean section.  We took her to the OR #2. The patient was intubated by Dr. Malen Gauze and his team without difficulty. She had been prepped and draped, prior to the intubation, a Foley had been inserted.  A low transverse incision was made carried down to the fascia.  Fascia scored in the  midline, extended laterally.  Rectus muscles were separated in midline.  Peritoneum was entered bluntly. Peritoneal incision was then stretched.  The bladder blade was inserted. The lower uterine segment was identified.  The bladder flap was created sharply and then digitally.  Low transverse incision was made in the uterus.  Uterus was entered with a hemostat.  The baby's breech was very low in the vagina.  I had to basically elevate it with my hand and I was able to deliver the baby via complete breech extraction.  There was a very strong contraction around the baby's head, so I actually had to extend the incision and was able to deliver the head without difficulty. The baby was a female infant and was then taken to the NICU team which was waiting in the OR to take care of the baby.  The baby subsequently went to the NICU because of prematurity.  The placenta was manually removed, noted to be normal intact with a three-vessel cord, and was sent to Pathology.  The uterus was exteriorized.  It was cleared of all clots and debris.  Antibiotics had been given.  The uterus appeared to be bicornuate in nature and the pregnancy seemed to arise from  the right side of the uterus.  When I inspected the incision, I could see there was an extension all the way down to the cervix and you could see the vagina and I stated that the baby head toward the cervix as it was trying to come out vaginally.  We then used 3-0 chromic to repair the extensions and then I closed the remainder of the incision with 3-0 chromic in a running locked stitch x2.  There was some oozing from the incision.  I used the Bovie.  Irrigation was performed.  Hemostasis looked good, but I did place some Surgicel across the incision because it had been oozing.  A Surgicel remained dry and we did observe her for a period of time.  The peritoneum was closed using 0 Vicryl running stitch.  The rectus muscles were reapproximated using 0  Vicryl.  The fascia was closed using 0 Vicryl starting each corner meeting in the midline.  After irrigation of subcutaneous layer, the skin was closed with 4-0 Vicryl subcuticular.  Dermabond was applied.  Local was infiltrated.  All sponge, lap, and instrument counts were correct x2. The patient went to recovery room in stable condition.     Mahitha Hickling L. Vincente Poli, M.D.     Florestine Avers  D:  12/04/2011  T:  12/05/2011  Job:  [id number]

## 2011-12-05 NOTE — Addendum Note (Signed)
Addendum  created 12/05/11 0919 by Edison Pace, CRNA   Modules edited:Notes Section

## 2011-12-05 NOTE — Progress Notes (Signed)
Subjective: Postpartum Day 1 Cesarean Delivery Patient reports incisional pain, tolerating PO and no problems voiding.    Objective: Vital signs in last 24 hours: Temp:  [98.1 F (36.7 C)-98.4 F (36.9 C)] 98.4 F (36.9 C) (02/23 0635) Pulse Rate:  [60-89] 72  (02/23 0635) Resp:  [11-20] 16  (02/23 0635) BP: (112-131)/(66-89) 112/68 mmHg (02/23 0635) SpO2:  [95 %-100 %] 97 % (02/23 4098)  Physical Exam:  General: alert, cooperative and appears stated age Lochia: appropriate Uterine Fundus: firm Incision: healing well, no significant drainage, no dehiscence, no significant erythema DVT Evaluation: No evidence of DVT seen on physical exam.   Basename 12/05/11 0505  HGB 9.7*  HCT 29.1*    Assessment/Plan: Status post Cesarean section. Doing well postoperatively.  Continue current care.  Reshard Guillet L 12/05/2011, 8:24 AM

## 2011-12-05 NOTE — Anesthesia Postprocedure Evaluation (Signed)
  Anesthesia Post-op Note  Patient: [name] Walker  Procedure(s) Performed: Procedure(s) (LRB): CESAREAN SECTION (N/A)  Patient Location: [facility] Unit  Anesthesia Type: General  Level of Consciousness: awake, alert  and oriented  Airway and Oxygen Therapy: Patient Spontanous Breathing  Post-op Pain: mild  Post-op Assessment: Patient's Cardiovascular Status Stable and Respiratory Function Stable  Post-op Vital Signs: Reviewed and stable  Complications: No apparent anesthesia complications

## 2011-12-05 NOTE — Addendum Note (Signed)
Addendum  created 12/05/11 0919 by Davell Beckstead, CRNA   Modules edited:Notes Section    

## 2011-12-06 NOTE — Progress Notes (Signed)
Pt in NICU

## 2011-12-06 NOTE — Progress Notes (Signed)
Pt remains in NICU.

## 2011-12-06 NOTE — Progress Notes (Signed)
Subjective: Postpartum Day 2: Cesarean Delivery Patient reports tolerating PO and no problems voiding.    Objective: Vital signs in last 24 hours: Temp:  [98.1 F (36.7 C)-99.7 F (37.6 C)] 98.1 F (36.7 C) (02/24 0550) Pulse Rate:  [67-86] 73  (02/24 0550) Resp:  [18] 18  (02/24 0550) BP: (98-110)/(63-80) 107/71 mmHg (02/24 0550) SpO2:  [97 %-99 %] 97 % (02/24 0550)  Physical Exam:  General: alert, cooperative and appears stated age Lochia: appropriate Uterine Fundus: firm Incision: healing well, no significant drainage, no dehiscence, no significant erythema DVT Evaluation: No evidence of DVT seen on physical exam.   Basename 12/05/11 0505  HGB 9.7*  HCT 29.1*    Assessment/Plan: Status post Cesarean section. Doing well postoperatively.  Continue current care BABY in NICU - may want to stay until Tuesday.  [name] Walker L 12/06/2011, 8:31 AM

## 2011-12-06 NOTE — Progress Notes (Signed)
LCSW attempted visit twice this date to follow up on referral for NICU admission and history of anxiety and previous behavioral health medication management.  On first attempt, MOB was not in room.  On second attempt, MOB was hosting a room full of guests.  Plan is for weekday LCSW to attempt visit to assess needs.   Staci Acosta, MSW, LCSW 12/06/2011, 4:04 pm

## 2011-12-07 ENCOUNTER — Encounter (HOSPITAL_COMMUNITY)
Admit: 2011-12-07 | Discharge: 2011-12-07 | Disposition: A | Discharge status: Home / Self Care | Payer: 59 | Attending: Obstetrics and Gynecology | Admitting: Obstetrics and Gynecology

## 2011-12-07 ENCOUNTER — Other Ambulatory Visit: Payer: Self-pay

## 2011-12-07 ENCOUNTER — Encounter (HOSPITAL_COMMUNITY): Payer: Self-pay | Admitting: Obstetrics and Gynecology

## 2011-12-07 DIAGNOSIS — O923 Agalactia: Secondary | ICD-10-CM | POA: Insufficient documentation

## 2011-12-07 LAB — COMPREHENSIVE METABOLIC PANEL
AST: 50 U/L — ABNORMAL HIGH (ref 0–37)
Albumin: 2.1 g/dL — ABNORMAL LOW (ref 3.5–5.2)
Alkaline Phosphatase: 81 U/L (ref 39–117)
BUN: 12 mg/dL (ref 6–23)
Chloride: 105 mEq/L (ref 96–112)
Potassium: 4.1 mEq/L (ref 3.5–5.1)
Total Protein: 5.6 g/dL — ABNORMAL LOW (ref 6.0–8.3)

## 2011-12-07 LAB — T4, FREE: Free T4: 1.2 ng/dL (ref 0.80–1.80)

## 2011-12-07 LAB — TSH: TSH: 1.959 u[IU]/mL (ref 0.350–4.500)

## 2011-12-07 NOTE — Progress Notes (Signed)
UR chart review completed.  

## 2011-12-07 NOTE — Progress Notes (Signed)
Patient ID: ANTONIA JICHA, female   DOB: Aug 04, 1976, 36 y.o.   MRN: [id number] Patient had reported to nurse, experiencing irregular heart rhythm. Has experienced before and has family history of irregular heart rhythm. Patient is uncertain of mom's diagnosis Patient denies SOB, but is very aware of the irregular rhythm. Previously reported to PCP but was not experiencing symptoms while undergoing previous EKG. Rhythm is irreg,with extra beat. No murmur or gallop noted. EKG ordered.  Julio Sicks, [facility]

## 2011-12-07 NOTE — Progress Notes (Signed)
Subjective: Postpartum Day 3: Cesarean Delivery.  Baby in NICU Patient reports tolerating PO, + flatus and no problems voiding.    Objective: Vital signs in last 24 hours: Temp:  [97.6 F (36.4 C)-98.4 F (36.9 C)] 97.6 F (36.4 C) (02/25 0500) Pulse Rate:  [71-87] 74  (02/25 0500) Resp:  [18] 18  (02/25 0500) BP: (106-116)/(70-73) 112/70 mmHg (02/25 0500) SpO2:  [97 %-99 %] 99 % (02/25 0500)  Physical Exam:  General: alert and cooperative Lochia: appropriate Uterine Fundus: firm Incision: healing well, no significant drainage, no significant erythema DVT Evaluation: No evidence of DVT seen on physical exam.   Basename 12/05/11 0505  HGB 9.7*  HCT 29.1*    Assessment/Plan: Status post Cesarean section. Doing well postoperatively.  Continue current care.  Plan for d/c home tomorrow.  [name] Walker 12/07/2011, 8:40 AM

## 2011-12-07 NOTE — Progress Notes (Signed)
Visited with pt while rounding on unit and had seen her previously on antenatal.  Pt was in good spirits and was delighted with baby, [name] Walker.  Baby is in NICU but she reports that she is doing well.    Offered my congratulations and encouragement.  [employer] Pager, [phone] 10:33 AM   12/07/11 1000  Clinical Encounter Type  Visited With Patient  Visit Type Follow-up  Spiritual Encounters  Spiritual Needs (Pt is doing well and was in good spirits.)

## 2011-12-07 NOTE — Progress Notes (Signed)
Called by RN w/ results of EKG.   Sinus Tachy with HR 103.  Frequent PVC w/ bigemeny.  Pt is without symptoms.  No CP or SOB.    A/P:  Will check electrolytes and TSH/FT4

## 2011-12-08 LAB — HEPATIC FUNCTION PANEL
ALT: 41 U/L — ABNORMAL HIGH (ref 0–35)
AST: 38 U/L — ABNORMAL HIGH (ref 0–37)
Albumin: 1.8 g/dL — ABNORMAL LOW (ref 3.5–5.2)
Total Bilirubin: 0.1 mg/dL — ABNORMAL LOW (ref 0.3–1.2)

## 2011-12-08 MED ORDER — IBUPROFEN 600 MG PO TABS: 600.0000 mg | ORAL_TABLET | Freq: Four times a day (QID) | ORAL | Status: AC

## 2011-12-08 NOTE — Progress Notes (Signed)
Pt discharged to home with husband.  Condition stable.  Ambulated to car with E. Pinion, NT.  No equipment ordered for home at discharge.  Pt rented breast pump from lactation department prior to leaving.

## 2011-12-08 NOTE — Progress Notes (Cosign Needed)
Subjective: Postpartum Day 4: Cesarean Delivery Patient reports tolerating PO, + BM and no problems voiding.  Baby stable in NICU. Patient continues to have intermittant irreg rhythm. Denies SOB, syncope  Objective: Vital signs in last 24 hours: Temp:  [97.5 F (36.4 C)-98.4 F (36.9 C)] 97.5 F (36.4 C) (02/26 0546) Pulse Rate:  [74-93] 79  (02/26 0546) Resp:  [18] 18  (02/26 0546) BP: (107-124)/(66-88) 107/70 mmHg (02/26 0546) SpO2:  [96 %-97 %] 97 % (02/26 0546)  Physical Exam:  General: alert and cooperative Lochia: appropriate Uterine Fundus: firm Incision: healing well DVT Evaluation: No evidence of DVT seen on physical exam.  No results found for this basename: HGB:2,HCT:2 in the last 72 hours  Assessment/Plan: Status post Cesarean section. Doing well postoperatively.  Discharge home with standard precautions and return to clinic in 1 week.  Samyah Bilbo G 12/08/2011, 8:29 AM

## 2011-12-08 NOTE — Discharge Summary (Signed)
Obstetric Discharge Summary Reason for Admission: rupture of membranes Prenatal Procedures: ultrasound Intrapartum Procedures: cesarean: low cervical, transverse Postpartum Procedures: none Complications-Operative and Postpartum: none Hemoglobin  Date Value Range Status  12/05/2011 9.7* 12.0-15.0 (g/dL) Final     HCT  Date Value Range Status  12/05/2011 29.1* 36.0-46.0 (%) Final    Discharge Diagnoses: 32 week IUP with PPROM  And breech presentation  Discharge Information: Date: 12/08/2011 Activity: pelvic rest Diet: routine Medications: PNV and Ibuprofen Condition: stable Instructions: refer to practice specific booklet Discharge to: home   Newborn Data: Live born female  Birth Weight: 3 lb 11.4 oz (1684 g) APGAR: 7, 9  Home with baby in NICU.  [name] Walker G 12/08/2011, 8:51 AM

## 2011-12-10 ENCOUNTER — Ambulatory Visit (INDEPENDENT_AMBULATORY_CARE_PROVIDER_SITE_OTHER): Payer: 59 | Admitting: Cardiology

## 2011-12-10 ENCOUNTER — Encounter: Payer: Self-pay | Admitting: Cardiology

## 2011-12-10 VITALS — BP 115/90 | HR 72 | Ht 70.0 in | Wt 123.0 lb

## 2011-12-10 DIAGNOSIS — I493 Ventricular premature depolarization: Secondary | ICD-10-CM

## 2011-12-10 DIAGNOSIS — I4949 Other premature depolarization: Secondary | ICD-10-CM

## 2011-12-10 NOTE — Assessment & Plan Note (Signed)
The patient has frequent ectopy as described. I'm going to start with a 24-hour Holter monitor and echocardiogram. I do note her tall thin body habitus but carefully questioned her about her family history and there has been no suspicion of Marfan's. She will need special attention paid to her aortic root size at the time of echo. I will consider further workup.

## 2011-12-10 NOTE — Progress Notes (Signed)
HPI The patient presents for evaluation of ventricular ectopy.  She has a long history of palpitations but has had no past workup of this. She's never had any cardiac history. She was recently pregnant and had her baby at 32 weeks last Friday. 2 days prior to delivery she noticed increased palpitations. Since delivery she's noticed her palpitations more. She describes isolated skipped beats that might be every other beat and last for most of the day. She does not have presyncope or syncope with these. He doesn't describe any chest pressure, neck or arm discomfort. She felt well during her pregnancy. She has not had PND or orthopnea. She had appropriate weight gain. She had no edema. I did review hospital records and she was noted to have the ectopy as described. TSH and other labs were normal.  She has been avoiding caffeine for the last 10 months.   Allergies  Allergen Reactions  . Sulfa Antibiotics Rash and Other (See Comments)    Rash is on hands and feet    Current Outpatient Prescriptions  Medication Sig Dispense Refill  . ibuprofen (ADVIL,MOTRIN) 600 MG tablet Take 1 tablet (600 mg total) by mouth every 6 (six) hours.  30 tablet  1  . Prenatal Vit-Fe Fumarate-FA (PRENATAL MULTIVITAMIN) TABS Take 1 tablet by mouth daily.        Past Medical History  Diagnosis Date  . Bicornate uterus   . Anxiety   . Abnormal Pap smear   . Hx LEEP (loop electrosurgical excision procedure), cervix, pregnancy     Past Surgical History  Procedure Date  . No past surgeries   . Cesarean section 12/04/2011    Procedure: CESAREAN SECTION;  Surgeon: Jeani Hawking, MD;  Location: [facility];  Service: Gynecology;  Laterality: N/A;    Family History  Problem Relation Age of Onset  . Arthritis Mother   . Cancer Mother   . Hyperlipidemia Mother   . Hypertension Mother   . Migraines Mother   . Breast cancer Mother 3  . Cancer Father   . Depression Brother   . Cancer Maternal Aunt   . Breast  cancer Maternal Aunt   . Ovarian cancer Maternal Aunt   . Cancer Maternal Grandmother   . Hypertension Maternal Grandmother   . Leukemia Maternal Grandmother   . Cancer Maternal Grandfather     History   Social History  . Marital Status: Married    Spouse Name: N/A    Number of Children: N/A  . Years of Education: N/A   Occupational History  . Not on file.   Social History Main Topics  . Smoking status: Former Smoker    Quit date: 10/12/1996  . Smokeless tobacco: Not on file  . Alcohol Use: No  . Drug Use: No  . Sexually Active: Yes   Other Topics Concern  . Not on file   Social History Narrative  . No narrative on file    ROS:  As stated in the HPI and negative for all other systems.  PHYSICAL EXAM BP 115/90  Pulse 72  Ht 5\' 10"  (1.778 m)  Wt 123 lb (55.792 kg)  BMI 17.65 kg/m2   GENERAL:  Well appearing, tall and thin HEENT:  Pupils equal round and reactive, fundi not visualized, oral mucosa unremarkable NECK:  No jugular venous distention, waveform within normal limits, carotid upstroke brisk and symmetric, no bruits, no thyromegaly LYMPHATICS:  No cervical, inguinal adenopathy LUNGS:  Clear to auscultation bilaterally BACK:  No CVA tenderness CHEST:  Unremarkable HEART:  PMI not displaced or sustained,S1 and S2 within normal limits, no S3, no S4, no clicks, no rubs, no murmurs ABD:  Flat, positive bowel sounds normal in frequency in pitch, no bruits, no rebound, no guarding, no midline pulsatile mass, no hepatomegaly, no splenomegaly EXT:  2 plus pulses throughout, no edema, no cyanosis no clubbing SKIN:  No rashes no nodules NEURO:  Cranial nerves II through XII grossly intact, motor grossly intact throughout PSYCH:  Cognitively intact, oriented to person place and time  EKG:  2/25  NSR with ventricular ectopy with bigeminy.  No acute ST T wave changes.  QTC slightly prolonged.  ASSESSMENT AND PLAN

## 2011-12-10 NOTE — Patient Instructions (Signed)
Your physician has requested that you have an echocardiogram. Echocardiography is a painless test that uses sound waves to create images of your heart. It provides your doctor with information about the size and shape of your heart and how well your heart's chambers and valves are working. This procedure takes approximately one hour. There are no restrictions for this procedure.  Your physician has recommended that you wear a 24 hour holter monitor ASAP per Dr. Antoine Poche. Holter monitors are medical devices that record the heart's electrical activity. Doctors most often use these monitors to diagnose arrhythmias. Arrhythmias are problems with the speed or rhythm of the heartbeat. The monitor is a small, portable device. You can wear one while you do your normal daily activities. This is usually used to diagnose what is causing palpitations/syncope (passing out).

## 2011-12-23 ENCOUNTER — Ambulatory Visit (HOSPITAL_COMMUNITY): Payer: 59

## 2011-12-30 ENCOUNTER — Encounter (INDEPENDENT_AMBULATORY_CARE_PROVIDER_SITE_OTHER): Payer: 59

## 2011-12-30 ENCOUNTER — Other Ambulatory Visit: Payer: Self-pay

## 2011-12-30 ENCOUNTER — Ambulatory Visit (HOSPITAL_COMMUNITY): Payer: 59 | Attending: Cardiovascular Disease

## 2011-12-30 DIAGNOSIS — I493 Ventricular premature depolarization: Secondary | ICD-10-CM

## 2011-12-30 DIAGNOSIS — I4949 Other premature depolarization: Secondary | ICD-10-CM

## 2011-12-30 DIAGNOSIS — R002 Palpitations: Secondary | ICD-10-CM | POA: Insufficient documentation

## 2011-12-30 NOTE — Progress Notes (Signed)
Post discharge ur review done per insurance request.

## 2012-01-06 ENCOUNTER — Encounter (HOSPITAL_COMMUNITY)
Admission: RE | Admit: 2012-01-06 | Discharge: 2012-01-06 | Disposition: A | Discharge status: Home / Self Care | Payer: 59 | Source: Ambulatory Visit | Attending: Obstetrics and Gynecology | Admitting: Obstetrics and Gynecology

## 2012-01-06 ENCOUNTER — Inpatient Hospital Stay (HOSPITAL_COMMUNITY)
Admission: AD | Admit: 2012-01-06 | Discharge: 2012-01-06 | Discharge status: Against Medical Advice | Payer: 59 | Source: Ambulatory Visit | Attending: Obstetrics and Gynecology | Admitting: Obstetrics and Gynecology

## 2012-01-06 DIAGNOSIS — O909 Complication of the puerperium, unspecified: Secondary | ICD-10-CM | POA: Insufficient documentation

## 2012-01-06 DIAGNOSIS — O923 Agalactia: Secondary | ICD-10-CM | POA: Insufficient documentation

## 2012-01-06 NOTE — MAU Note (Signed)
Pt states, " I had a C/S at 32 wks  Feb 22nd. While I was holding my baby in NICU, I had a terrible pain at the right side of my incision site. I got nauseated and sweaty, but it has since gone away."  ( no redness, drainage or swelling noted. Incision appears well healed.)

## 2012-01-21 ENCOUNTER — Other Ambulatory Visit: Payer: Self-pay | Admitting: Obstetrics and Gynecology

## 2012-02-06 ENCOUNTER — Encounter (HOSPITAL_COMMUNITY)
Admission: RE | Admit: 2012-02-06 | Discharge: 2012-02-06 | Disposition: A | Discharge status: Home / Self Care | Payer: 59 | Source: Ambulatory Visit | Attending: Obstetrics and Gynecology | Admitting: Obstetrics and Gynecology

## 2012-02-06 DIAGNOSIS — O923 Agalactia: Secondary | ICD-10-CM | POA: Insufficient documentation

## 2012-02-19 ENCOUNTER — Encounter (HOSPITAL_COMMUNITY): Payer: Self-pay | Admitting: *Deleted

## 2012-02-19 ENCOUNTER — Inpatient Hospital Stay (HOSPITAL_COMMUNITY)
Admission: AD | Admit: 2012-02-19 | Discharge: 2012-02-19 | Disposition: A | Discharge status: Home / Self Care | Payer: 59 | Source: Ambulatory Visit | Attending: Obstetrics and Gynecology | Admitting: Obstetrics and Gynecology

## 2012-02-19 DIAGNOSIS — N644 Mastodynia: Secondary | ICD-10-CM

## 2012-02-19 DIAGNOSIS — O9122 Nonpurulent mastitis associated with the puerperium: Secondary | ICD-10-CM | POA: Insufficient documentation

## 2012-02-19 DIAGNOSIS — O9229 Other disorders of breast associated with pregnancy and the puerperium: Secondary | ICD-10-CM | POA: Insufficient documentation

## 2012-02-19 MED ORDER — CEPHALEXIN 500 MG PO CAPS: 500.0000 mg | ORAL_CAPSULE | Freq: Three times a day (TID) | ORAL | Status: AC

## 2012-02-19 NOTE — MAU Provider Note (Signed)
History     CSN: [id number]  Arrival date and time: 02/19/12 2152   First Provider Initiated Contact with Patient 02/19/12 2228      Chief Complaint  Patient presents with  . Breast Pain  . Fever   HPI [name] Walker 36 y.o. Comes to MAU with sore breast, fever, and feeling bad.  Thinks she has mastitis.  Is currently breastfeeding.  Fever at home was 100.4 and 100.8.  Has not taken any medication.    OB History    Grav Para Term Preterm Abortions TAB SAB Ect Mult Living   1 1 0 1 0 0 0 0 0 1       Past Medical History  Diagnosis Date  . Bicornate uterus   . Anxiety   . Abnormal Pap smear   . Hx LEEP (loop electrosurgical excision procedure), cervix, pregnancy   . TMJ (temporomandibular joint syndrome)     Past Surgical History  Procedure Date  . No past surgeries   . Cesarean section 12/04/2011    Procedure: CESAREAN SECTION;  Surgeon: Jeani Hawking, MD;  Location: [facility];  Service: Gynecology;  Laterality: N/A;    Family History  Problem Relation Age of Onset  . Arthritis Mother   . Cancer Mother   . Hyperlipidemia Mother   . Hypertension Mother   . Migraines Mother   . Breast cancer Mother 65  . Cancer Father   . Depression Brother   . Cancer Maternal Aunt   . Breast cancer Maternal Aunt   . Ovarian cancer Maternal Aunt   . Cancer Maternal Grandmother   . Hypertension Maternal Grandmother   . Leukemia Maternal Grandmother   . Cancer Maternal Grandfather   . Anesthesia problems Neg Hx   . Hypotension Neg Hx   . Malignant hyperthermia Neg Hx   . Pseudochol deficiency Neg Hx     History  Substance Use Topics  . Smoking status: Former Smoker    Quit date: 10/12/1996  . Smokeless tobacco: Not on file  . Alcohol Use: No    Allergies:  Allergies  Allergen Reactions  . Sulfa Antibiotics Rash and Other (See Comments)    Rash is on hands and feet    Prescriptions prior to admission  Medication Sig Dispense Refill  . Docosahexaenoic Acid  (DHA COMPLETE PO) Take 1 capsule by mouth daily.      . Prenatal Vit-Fe Fumarate-FA (PRENATAL MULTIVITAMIN) TABS Take 1 tablet by mouth daily.      Marland Kitchen PRESCRIPTION MEDICATION Take 1 tablet by mouth daily. Takes the mini-pill birth control progesterone        Review of Systems  Gastrointestinal: Negative for nausea, vomiting, abdominal pain and diarrhea.  Genitourinary:       Breast pain in left breast   Physical Exam   Blood pressure 115/73, temperature 99 F (37.2 C), temperature source Oral, resp. rate 20, height 5\' 10"  (1.778 m), weight 115 lb (52.164 kg), currently breastfeeding.  Physical Exam  Nursing note and vitals reviewed. Constitutional: She is oriented to person, place, and time. She appears well-developed and well-nourished.  HENT:  Head: Normocephalic.  Eyes: EOM are normal.  Neck: Neck supple.  Genitourinary:       Right breast - no pain, no redness Left breast - nipple pink, area of tenderness at 12 o'clock beside the areola.  Some very diffuse, very lightly pink area across breast from 10 -2.    Musculoskeletal: Normal range of motion.  Neurological: She  is alert and oriented to person, place, and time.  Skin: Skin is warm and dry.  Psychiatric: She has a normal mood and affect.    MAU Course  Procedures  MDM Consult with Dr. Vincente Poli - plugged duct vs. Beginning mastitis.  Will cover with antibiotic.  Assessment and Plan  Plugged duct vs mastitis  Plan Reviewed measures for clearing plugged duct. Rx keflex 500 mg one po tid x 7 days Follow up in the office if your symptoms worsen.  Ekam Bonebrake 02/19/2012, 10:41 PM

## 2012-02-19 NOTE — MAU Note (Signed)
L breast hurting all day. Cont. Pumping and feeding. At 1800 started feeling really bad all over. Had temp of 100.5 at home and called ans serv and told to come in.

## 2012-02-19 NOTE — Progress Notes (Signed)
Written and verbal d/c instructions given and understanding voiced. 

## 2012-02-19 NOTE — Discharge Instructions (Signed)
Make sure to use warm compresses and give some firm stroking toward the nipple across the sore area during breastfeeding to encourage full drainage of the area. Call the breastfeeding consultants if you have questions.

## 2012-03-08 ENCOUNTER — Encounter (HOSPITAL_COMMUNITY)
Admission: RE | Admit: 2012-03-08 | Discharge: 2012-03-08 | Disposition: A | Discharge status: Home / Self Care | Payer: 59 | Source: Ambulatory Visit | Attending: Obstetrics and Gynecology | Admitting: Obstetrics and Gynecology

## 2012-03-08 DIAGNOSIS — O923 Agalactia: Secondary | ICD-10-CM | POA: Insufficient documentation

## 2012-04-08 ENCOUNTER — Encounter (HOSPITAL_COMMUNITY)
Admission: RE | Admit: 2012-04-08 | Discharge: 2012-04-08 | Disposition: A | Discharge status: Home / Self Care | Payer: 59 | Source: Ambulatory Visit | Attending: Obstetrics and Gynecology | Admitting: Obstetrics and Gynecology

## 2012-04-08 DIAGNOSIS — O923 Agalactia: Secondary | ICD-10-CM | POA: Insufficient documentation

## 2012-05-09 ENCOUNTER — Encounter (HOSPITAL_COMMUNITY)
Admission: RE | Admit: 2012-05-09 | Discharge: 2012-05-09 | Disposition: A | Discharge status: Home / Self Care | Payer: 59 | Source: Ambulatory Visit | Attending: Obstetrics and Gynecology | Admitting: Obstetrics and Gynecology

## 2012-05-09 DIAGNOSIS — O923 Agalactia: Secondary | ICD-10-CM | POA: Insufficient documentation

## 2012-06-09 ENCOUNTER — Encounter (HOSPITAL_COMMUNITY)
Admission: RE | Admit: 2012-06-09 | Discharge: 2012-06-09 | Disposition: A | Discharge status: Home / Self Care | Payer: 59 | Source: Ambulatory Visit | Attending: Obstetrics and Gynecology | Admitting: Obstetrics and Gynecology

## 2012-06-09 DIAGNOSIS — O923 Agalactia: Secondary | ICD-10-CM | POA: Insufficient documentation

## 2012-07-10 ENCOUNTER — Encounter (HOSPITAL_COMMUNITY)
Admission: RE | Admit: 2012-07-10 | Discharge: 2012-07-10 | Disposition: A | Discharge status: Home / Self Care | Payer: 59 | Source: Ambulatory Visit | Attending: Obstetrics and Gynecology | Admitting: Obstetrics and Gynecology

## 2012-07-10 DIAGNOSIS — O923 Agalactia: Secondary | ICD-10-CM | POA: Insufficient documentation

## 2012-08-09 ENCOUNTER — Encounter (HOSPITAL_COMMUNITY)
Admission: RE | Admit: 2012-08-09 | Discharge: 2012-08-09 | Disposition: A | Discharge status: Home / Self Care | Payer: 59 | Source: Ambulatory Visit | Attending: Obstetrics and Gynecology | Admitting: Obstetrics and Gynecology

## 2012-08-09 DIAGNOSIS — O923 Agalactia: Secondary | ICD-10-CM | POA: Insufficient documentation

## 2012-09-09 ENCOUNTER — Encounter (HOSPITAL_COMMUNITY)
Admission: RE | Admit: 2012-09-09 | Discharge: 2012-09-09 | Disposition: A | Discharge status: Home / Self Care | Payer: 59 | Source: Ambulatory Visit | Attending: Obstetrics and Gynecology | Admitting: Obstetrics and Gynecology

## 2012-09-09 DIAGNOSIS — O923 Agalactia: Secondary | ICD-10-CM | POA: Insufficient documentation

## 2012-10-09 ENCOUNTER — Encounter (HOSPITAL_COMMUNITY)
Admission: RE | Admit: 2012-10-09 | Discharge: 2012-10-09 | Disposition: A | Discharge status: Home / Self Care | Payer: 59 | Source: Ambulatory Visit | Attending: Obstetrics and Gynecology | Admitting: Obstetrics and Gynecology

## 2012-10-09 DIAGNOSIS — O923 Agalactia: Secondary | ICD-10-CM | POA: Insufficient documentation

## 2013-04-06 ENCOUNTER — Other Ambulatory Visit: Payer: Self-pay | Admitting: Obstetrics and Gynecology

## 2013-10-12 NOTE — L&D Delivery Note (Signed)
Delivery Note Dr. Matthew Saras en route. Called to stand-by for G2P0101 in advanced labor with hx C/S and with cerclage. Bedside US: cephalic. SVE: C/C/+2 bulging bag. Seconds after exam SROM> clear and involuntary pushing. FHR 70's> +FSS and encouraged pushing. NICU team at delivery. At 6:40 PM a viable female was delivered via  (Presentation: OA>LOA  ).  APGAR: , ; weight  .   Placenta status: , .  Cord:  with the following complications: .  Cord pH:  Dr. Matthew Saras present for 3rd stage management. Anesthesia: none Episiotomy:  none Lacerations:  none Suture Repair: n/a Est. Blood Loss (mL):    Mom to postpartum.  Baby to  [facility] 09/09/2014, 6:44 PM

## 2014-03-12 LAB — OB RESULTS CONSOLE ANTIBODY SCREEN: Antibody Screen: NEGATIVE

## 2014-03-12 LAB — OB RESULTS CONSOLE RUBELLA ANTIBODY, IGM: RUBELLA: IMMUNE

## 2014-03-12 LAB — OB RESULTS CONSOLE HEPATITIS B SURFACE ANTIGEN: Hepatitis B Surface Ag: NEGATIVE

## 2014-03-12 LAB — OB RESULTS CONSOLE ABO/RH: RH Type: POSITIVE

## 2014-03-12 LAB — OB RESULTS CONSOLE HIV ANTIBODY (ROUTINE TESTING): HIV: NONREACTIVE

## 2014-03-12 LAB — OB RESULTS CONSOLE RPR: RPR: NONREACTIVE

## 2014-03-26 ENCOUNTER — Encounter (HOSPITAL_COMMUNITY): Payer: Self-pay | Admitting: Pharmacist

## 2014-04-03 ENCOUNTER — Other Ambulatory Visit: Payer: Self-pay | Admitting: Obstetrics and Gynecology

## 2014-04-03 LAB — OB RESULTS CONSOLE GC/CHLAMYDIA
CHLAMYDIA, DNA PROBE: NEGATIVE
GC PROBE AMP, GENITAL: NEGATIVE

## 2014-04-04 LAB — CYTOLOGY - PAP

## 2014-04-04 NOTE — H&P (Signed)
[name] Walker is an 38 y.o.G 2 P 0101 [facility] 10/19/2014 presents for McDonald Cervical cerclage. Last pregnancy complicated by PTD at 32 weeks due to most likely cervical incompetence.She has a bicornuate uterus and history of LEEP in 2008.  Pertinent Gynecological History: Menses: pregnant Bleeding: none Contraception: pregnant DES exposure: denies Blood transfusions: none Sexually transmitted diseases: no past history Previous GYN Procedures: leep  Last mammogram: not applicable Date:  Last pap: normal Date: 2015 OB History: G2, P 0101  Menstrual History: Menarche age: unknown  No LMP recorded. Patient is not currently having periods (Reason: Lactating).    Past Medical History  Diagnosis Date  . Bicornate uterus   . Anxiety   . Abnormal Pap smear   . Hx LEEP (loop electrosurgical excision procedure), cervix, pregnancy   . TMJ (temporomandibular joint syndrome)     Past Surgical History  Procedure Laterality Date  . No past surgeries    . Cesarean section  12/04/2011    Procedure: CESAREAN SECTION;  Surgeon: Cyril Mourning, MD;  Location: [facility];  Service: Gynecology;  Laterality: N/A;    Family History  Problem Relation Age of Onset  . Arthritis Mother   . Cancer Mother   . Hyperlipidemia Mother   . Hypertension Mother   . Migraines Mother   . Breast cancer Mother 59  . Cancer Father   . Depression Brother   . Cancer Maternal Aunt   . Breast cancer Maternal Aunt   . Ovarian cancer Maternal Aunt   . Cancer Maternal Grandmother   . Hypertension Maternal Grandmother   . Leukemia Maternal Grandmother   . Cancer Maternal Grandfather   . Anesthesia problems Neg Hx   . Hypotension Neg Hx   . Malignant hyperthermia Neg Hx   . Pseudochol deficiency Neg Hx     Social History:  reports that she quit smoking about 17 years ago. She does not have any smokeless tobacco history on file. She reports that she does not drink alcohol or use illicit drugs.  Allergies:   Allergies  Allergen Reactions  . Sulfa Antibiotics Rash and Other (See Comments)    Rash is on hands and feet    No prescriptions prior to admission    ROS  currently breastfeeding. Physical Exam  Nursing note and vitals reviewed. Constitutional: She appears well-developed.  HENT:  Head: Normocephalic.  Eyes: Pupils are equal, round, and reactive to light.  Neck: Normal range of motion.  Cardiovascular: Normal rate and normal heart sounds.   Respiratory: Effort normal.  GI: Soft.    No results found for this or any previous visit (from the past 24 hour(s)).  No results found.  Assessment/Plan: IUP at 11 weeks 6 days Cervical incompetence McDonald cerclage Risks discussed with patient Consent signed Progesterone in recovery  GREWAL,MICHELLE L 04/04/2014, 9:33 AM

## 2014-04-05 ENCOUNTER — Ambulatory Visit (HOSPITAL_COMMUNITY)
Admission: RE | Admit: 2014-04-05 | Discharge: 2014-04-05 | Disposition: A | Discharge status: Home / Self Care | Payer: 59 | Source: Ambulatory Visit | Attending: Obstetrics and Gynecology | Admitting: Obstetrics and Gynecology

## 2014-04-05 ENCOUNTER — Ambulatory Visit (HOSPITAL_COMMUNITY): Payer: 59 | Admitting: Anesthesiology

## 2014-04-05 ENCOUNTER — Encounter (HOSPITAL_COMMUNITY)
Admission: RE | Disposition: A | Discharge status: Home / Self Care | Payer: Self-pay | Source: Ambulatory Visit | Attending: Obstetrics and Gynecology

## 2014-04-05 ENCOUNTER — Encounter (HOSPITAL_COMMUNITY): Payer: Self-pay | Admitting: *Deleted

## 2014-04-05 ENCOUNTER — Encounter (HOSPITAL_COMMUNITY): Payer: 59 | Admitting: Anesthesiology

## 2014-04-05 DIAGNOSIS — Q513 Bicornate uterus: Secondary | ICD-10-CM | POA: Insufficient documentation

## 2014-04-05 DIAGNOSIS — M26609 Unspecified temporomandibular joint disorder, unspecified side: Secondary | ICD-10-CM | POA: Insufficient documentation

## 2014-04-05 DIAGNOSIS — Z87891 Personal history of nicotine dependence: Secondary | ICD-10-CM | POA: Insufficient documentation

## 2014-04-05 DIAGNOSIS — O3430 Maternal care for cervical incompetence, unspecified trimester: Secondary | ICD-10-CM | POA: Insufficient documentation

## 2014-04-05 DIAGNOSIS — O3431 Maternal care for cervical incompetence, first trimester: Secondary | ICD-10-CM

## 2014-04-05 DIAGNOSIS — O3400 Maternal care for unspecified congenital malformation of uterus, unspecified trimester: Secondary | ICD-10-CM | POA: Insufficient documentation

## 2014-04-05 HISTORY — PX: CERVICAL CERCLAGE: SHX1329

## 2014-04-05 LAB — CBC
HEMATOCRIT: 38.1 % (ref 36.0–46.0)
Hemoglobin: 13 g/dL (ref 12.0–15.0)
MCH: 31.3 pg (ref 26.0–34.0)
MCHC: 34.1 g/dL (ref 30.0–36.0)
MCV: 91.8 fL (ref 78.0–100.0)
PLATELETS: 203 10*3/uL (ref 150–400)
RBC: 4.15 MIL/uL (ref 3.87–5.11)
RDW: 12.5 % (ref 11.5–15.5)
WBC: 10.8 10*3/uL — AB (ref 4.0–10.5)

## 2014-04-05 SURGERY — CERCLAGE, CERVIX, VAGINAL APPROACH
Anesthesia: Spinal

## 2014-04-05 MED ORDER — HYDROXYPROGESTERONE CAPROATE 250 MG/ML IM OIL
250.0000 mg | TOPICAL_OIL | Freq: Once | INTRAMUSCULAR | Status: AC
  Administered 2014-04-05: 250 mg via INTRAMUSCULAR
  Filled 2014-04-05: qty 1

## 2014-04-05 MED ORDER — LACTATED RINGERS IV SOLN: INTRAVENOUS | Status: DC

## 2014-04-05 MED ORDER — LACTATED RINGERS IV SOLN
INTRAVENOUS | Status: DC
  Administered 2014-04-05 (×2): via INTRAVENOUS

## 2014-04-05 MED ORDER — CEFAZOLIN SODIUM-DEXTROSE 2-3 GM-% IV SOLR
2.0000 g | INTRAVENOUS | Status: AC
Start: 2014-04-05 — End: 2014-04-05
  Administered 2014-04-05: 2 g via INTRAVENOUS

## 2014-04-05 MED ORDER — 0.9 % SODIUM CHLORIDE (POUR BTL) OPTIME
TOPICAL | Status: DC | PRN
  Administered 2014-04-05: 1000 mL

## 2014-04-05 MED ORDER — BUPIVACAINE IN DEXTROSE 0.75-8.25 % IT SOLN
INTRATHECAL | Status: DC | PRN
  Administered 2014-04-05: 1 mL via INTRATHECAL

## 2014-04-05 SURGICAL SUPPLY — 15 items
CLOTH BEACON ORANGE TIMEOUT ST (SAFETY) ×3 IMPLANT
COUNTER NEEDLE 1200 MAGNETIC (NEEDLE) ×2 IMPLANT
GLOVE BIO SURGEON STRL SZ 6.5 (GLOVE) ×2 IMPLANT
GLOVE BIO SURGEONS STRL SZ 6.5 (GLOVE) ×1
GOWN STRL REUS W/TWL LRG LVL3 (GOWN DISPOSABLE) ×6 IMPLANT
PACK VAGINAL MINOR WOMEN LF (CUSTOM PROCEDURE TRAY) ×3 IMPLANT
PAD OB MATERNITY 4.3X12.25 (PERSONAL CARE ITEMS) ×3 IMPLANT
PAD PREP 24X48 CUFFED NSTRL (MISCELLANEOUS) ×3 IMPLANT
SUT MERSILENE 5MM BP 1 12 (SUTURE) ×6 IMPLANT
TOWEL OR 17X24 6PK STRL BLUE (TOWEL DISPOSABLE) ×6 IMPLANT
TRAY FOLEY CATH 14FR (SET/KITS/TRAYS/PACK) ×3 IMPLANT
TUBING NON-CON 1/4 X 20 CONN (TUBING) IMPLANT
TUBING NON-CON 1/4 X 20' CONN (TUBING)
WATER STERILE IRR 1000ML POUR (IV SOLUTION) ×3 IMPLANT
YANKAUER SUCT BULB TIP NO VENT (SUCTIONS) IMPLANT

## 2014-04-05 NOTE — Brief Op Note (Signed)
04/05/2014  1:28 PM  PATIENT:  [name] Walker  38 y.o. female  PRE-OPERATIVE DIAGNOSIS:  incompetent cervix, IUP at 11 6/7  POST-OPERATIVE DIAGNOSIS:  incompetent cervix, IUP at 11 6/7  PROCEDURE:  Procedure(s): CERCLAGE CERVICAL (N/A)  SURGEON:  Surgeon(s) and Role:    * Cyril Mourning, MD - Primary  PHYSICIAN ASSISTANT:   ASSISTANTS: none   ANESTHESIA:   spinal  EBL:  Total I/O In: 400 [I.V.:400] Out: 75 [Urine:75]  BLOOD ADMINISTERED:none  DRAINS: Urinary Catheter (Foley)   LOCAL MEDICATIONS USED:  NONE  SPECIMEN:  No Specimen  DISPOSITION OF SPECIMEN:  N/A  COUNTS:  YES  TOURNIQUET:  * No tourniquets in log *  DICTATION: .Other Dictation: Dictation Number [id number]  PLAN OF CARE: Discharge to home after PACU  PATIENT DISPOSITION:  PACU - hemodynamically stable.   Delay start of Pharmacological VTE agent (>24hrs) due to surgical blood loss or risk of bleeding: not applicable

## 2014-04-05 NOTE — Anesthesia Preprocedure Evaluation (Signed)
Anesthesia Evaluation  Patient identified by MRN, date of birth, ID band Patient awake    Reviewed: Allergy & Precautions, H&P , NPO status , Patient's Chart, lab work & pertinent test results, reviewed documented beta blocker date and time   History of Anesthesia Complications Negative for: history of anesthetic complications  Airway  TM Distance: >3 FB Neck ROM: full   Comment: +TMJ Dental  (+) Teeth Intact   Pulmonary neg pulmonary ROS, former smoker,  breath sounds clear to auscultation        Cardiovascular Rhythm:regular Rate:Normal  PVCs   Neuro/Psych Anxiety negative neurological ROS     GI/Hepatic negative GI ROS, Neg liver ROS,   Endo/Other  negative endocrine ROS  Renal/GU negative Renal ROS     Musculoskeletal   Abdominal   Peds  Hematology negative hematology ROS (+)   Anesthesia Other Findings   Reproductive/Obstetrics (+) Pregnancy (incompetent cervix, 21w6dys)                           Anesthesia Physical Anesthesia Plan  ASA: II  Anesthesia Plan: Spinal   Post-op Pain Management:    Induction:   Airway Management Planned:   Additional Equipment:   Intra-op Plan:   Post-operative Plan:   Informed Consent: I have reviewed the patients History and Physical, chart, labs and discussed the procedure including the risks, benefits and alternatives for the proposed anesthesia with the patient or authorized representative who has indicated his/her understanding and acceptance.     Plan Discussed with:   Anesthesia Plan Comments:         Anesthesia Quick Evaluation

## 2014-04-05 NOTE — Anesthesia Procedure Notes (Signed)
Spinal  Patient location during procedure: OR Start time: 04/05/2014 1:01 PM Staffing Anesthesiologist: Rudean Curt Performed by: anesthesiologist  Preanesthetic Checklist Completed: patient identified, site marked, surgical consent, pre-op evaluation, timeout performed, IV checked, risks and benefits discussed and monitors and equipment checked Spinal Block Patient position: sitting Prep: DuraPrep Patient monitoring: heart rate, cardiac monitor, continuous pulse ox and blood pressure Approach: midline Location: L3-4 Injection technique: single-shot Needle Needle type: Sprotte  Needle gauge: 24 G Needle length: 9 cm Assessment Sensory level: T4 Additional Notes Patient identified.  Risk benefits discussed including failed block, incomplete pain control, headache, nerve damage, paralysis, blood pressure changes, nausea, vomiting, reactions to medication both toxic or allergic, and postpartum back pain.  Patient expressed understanding and wished to proceed.  All questions were answered.  Sterile technique used throughout procedure.  CSF was clear.  No parasthesia or other complications.  Please see nursing notes for vital signs.

## 2014-04-05 NOTE — Discharge Instructions (Signed)
[place] - Preparing for Surgery  Before surgery, you can play an important role.  Because skin is not sterile, your skin needs to be as free of germs as possible.  You can reduce the number of germs on you skin by washing with CHG (chlorahexidine gluconate) soap before surgery.  CHG is an antiseptic cleaner which kills germs and bonds with the skin to continue killing germs even after washing.  Please DO NOT use if you have an allergy to CHG or antibacterial soaps.  If your skin becomes reddened/irritated stop using the CHG and inform your nurse when you arrive at Short Stay.  Do not shave (including legs and underarms) for at least 48 hours prior to the first CHG shower.  You may shave your face.  Please follow these instructions carefully:   1.  Shower with CHG Soap the night before surgery and the                                morning of Surgery.  2.  If you choose to wash your hair, wash your hair first as usual with your       normal shampoo.  3.  After you shampoo, rinse your hair and body thoroughly to remove the                      Shampoo.  4.  Use CHG as you would any other liquid soap.  You can apply chg directly       to the skin and wash gently with scrungie or a clean washcloth.  5.  Apply the CHG Soap to your body ONLY FROM THE NECK DOWN.        Do not use on open wounds or open sores.  Avoid contact with your eyes,       ears, mouth and genitals (private parts).  Wash genitals (private parts)       with your normal soap.  6.  Wash thoroughly, paying special attention to the area where your surgery        will be performed.  7.  Thoroughly rinse your body with warm water from the neck down.  8.  DO NOT shower/wash with your normal soap after using and rinsing off       the CHG Soap.  9.  Pat yourself dry with a clean towel.            10.  Wear clean pajamas.            11.  Place clean sheets on your bed the night of your first shower and do not        sleep with  pets.  Day of Surgery  Do not apply any lotions/deoderants the morning of surgery.  Please wear clean clothes to the hospital/surgery center.

## 2014-04-05 NOTE — Transfer of Care (Signed)
Immediate Anesthesia Transfer of Care Note  Patient: [name] Walker  Procedure(s) Performed: Procedure(s): CERCLAGE CERVICAL (N/A)  Patient Location: PACU  Anesthesia Type:Spinal  Level of Consciousness: awake, alert  and oriented  Airway & Oxygen Therapy: Patient Spontanous Breathing  Post-op Assessment: Report given to PACU RN and Post -op Vital signs reviewed and stable  Post vital signs: Reviewed and stable  Complications: No apparent anesthesia complications

## 2014-04-05 NOTE — Progress Notes (Signed)
H and P on the chart No significant changes Will proceed with McDonald Cerclage Consent signed

## 2014-04-05 NOTE — Anesthesia Postprocedure Evaluation (Signed)
  Anesthesia Post Note  Patient: [name] Walker  Procedure(s) Performed: Procedure(s) (LRB): CERCLAGE CERVICAL (N/A)  Anesthesia type: Spinal  Patient location: PACU  Post pain: Pain level controlled  Post assessment: Post-op Vital signs reviewed  Last Vitals:  Filed Vitals:   04/05/14 1330  BP:   Pulse:   Temp:   Resp: 16    Post vital signs: Reviewed  Level of consciousness: awake  Complications: No apparent anesthesia complications

## 2014-04-06 NOTE — Op Note (Signed)
NAMEMarland Walker  CHALISA, KOBLER            ACCOUNT NO.:  [id number]  MEDICAL RECORD NO.:  [id number]  LOCATION:  [facility]                          FACILITY:  [facility]  PHYSICIAN:  Michelle L. Grewal, M.D.DATE OF BIRTH:  1976/05/03  DATE OF PROCEDURE:  04/05/2014 DATE OF DISCHARGE:  04/05/2014                              OPERATIVE REPORT   PREOPERATIVE DIAGNOSES: 1. Intrauterine pregnancy at 11 weeks and 6 days. 2. Cervical incompetence.  POSTOPERATIVE DIAGNOSES: 1. Intrauterine pregnancy at 11 weeks and 6 days. 2. Cervical incompetence.  PROCEDURE:  McDonald cervical cerclage.  SURGEON:  Michelle L. Helane Rima, M.D.  ANESTHESIA:  Spinal.  ESTIMATED BLOOD LOSS:  Minimal.  COMPLICATIONS:  None.  DESCRIPTION OF PROCEDURE:  The patient was taken to the operating room. She was then given her spinal.  She was prepped and draped.  A Foley catheter was inserted.  The speculum was inserted into the vagina.  The cervix was identified.  It did appear to be slightly short.  It was closed.  Cerclage forceps were placed in the anterior and posterior lips and using Mersilene suture, a stitch was placed from 12 to 3 o'clock, 3 to 6 o'clock, 12 to 9 o'clock, and 9 to 6 o'clock, and the stitch was tied down easily in the 6 o'clock position.  At the end of the procedure, bleeding was minimal.  All instruments were removed from the vagina.  The patient will go to recovery room in stable condition.     Michelle L. Helane Rima, M.D.     Nevin Bloodgood  D:  04/05/2014  T:  04/06/2014  Job:  [id number]

## 2014-04-07 ENCOUNTER — Encounter (HOSPITAL_COMMUNITY): Payer: Self-pay | Admitting: Obstetrics and Gynecology

## 2014-07-31 ENCOUNTER — Encounter (HOSPITAL_COMMUNITY): Payer: Self-pay | Admitting: *Deleted

## 2014-07-31 ENCOUNTER — Inpatient Hospital Stay (HOSPITAL_COMMUNITY)
Admission: AD | Admit: 2014-07-31 | Discharge: 2014-07-31 | Disposition: A | Discharge status: Home / Self Care | Payer: 59 | Source: Ambulatory Visit | Attending: Obstetrics & Gynecology | Admitting: Obstetrics & Gynecology

## 2014-07-31 DIAGNOSIS — Z3A28 28 weeks gestation of pregnancy: Secondary | ICD-10-CM | POA: Diagnosis not present

## 2014-07-31 MED ORDER — BETAMETHASONE SOD PHOS & ACET 6 (3-3) MG/ML IJ SUSP
12.0000 mg | Freq: Once | INTRAMUSCULAR | Status: AC
  Administered 2014-07-31: 12 mg via INTRAMUSCULAR
  Filled 2014-07-31: qty 2

## 2014-07-31 NOTE — MAU Note (Signed)
Pt sent from office for 1st betamethasone inj. Cervix short in office, has circlage, and has hx of preterm del at 32wks.

## 2014-07-31 NOTE — MAU Note (Signed)
Pt tol betamethasone without problems. D/C to home and will return in 24hrs for 2nd shot.

## 2014-08-01 ENCOUNTER — Inpatient Hospital Stay (HOSPITAL_COMMUNITY)
Admission: AD | Admit: 2014-08-01 | Discharge: 2014-08-01 | Disposition: A | Discharge status: Home / Self Care | Payer: 59 | Source: Ambulatory Visit | Attending: Obstetrics and Gynecology | Admitting: Obstetrics and Gynecology

## 2014-08-01 DIAGNOSIS — Z3A28 28 weeks gestation of pregnancy: Secondary | ICD-10-CM | POA: Insufficient documentation

## 2014-08-01 MED ORDER — BETAMETHASONE SOD PHOS & ACET 6 (3-3) MG/ML IJ SUSP
12.0000 mg | Freq: Once | INTRAMUSCULAR | Status: AC
  Administered 2014-08-01: 12 mg via INTRAMUSCULAR
  Filled 2014-08-01: qty 2

## 2014-08-01 NOTE — MAU Note (Signed)
Here for 2nd dose of betamethasone.  No complaints.  Had received betamethasone with with preg.

## 2014-08-01 NOTE — MAU Note (Signed)
No complaints, no reaction to injection.

## 2014-08-13 ENCOUNTER — Encounter (HOSPITAL_COMMUNITY): Payer: Self-pay | Admitting: *Deleted

## 2014-09-09 ENCOUNTER — Encounter (HOSPITAL_COMMUNITY): Payer: Self-pay | Admitting: Neonatology

## 2014-09-09 ENCOUNTER — Inpatient Hospital Stay (HOSPITAL_COMMUNITY)
Admission: AD | Admit: 2014-09-09 | Discharge: 2014-09-11 | DRG: 775 | Disposition: A | Discharge status: Home / Self Care | Payer: 59 | Source: Ambulatory Visit | Attending: Obstetrics and Gynecology | Admitting: Obstetrics and Gynecology

## 2014-09-09 DIAGNOSIS — Z3483 Encounter for supervision of other normal pregnancy, third trimester: Secondary | ICD-10-CM | POA: Diagnosis present

## 2014-09-09 DIAGNOSIS — Z3A34 34 weeks gestation of pregnancy: Secondary | ICD-10-CM | POA: Diagnosis present

## 2014-09-09 DIAGNOSIS — O923 Agalactia: Secondary | ICD-10-CM | POA: Diagnosis present

## 2014-09-09 DIAGNOSIS — Z3490 Encounter for supervision of normal pregnancy, unspecified, unspecified trimester: Secondary | ICD-10-CM

## 2014-09-09 LAB — COMPREHENSIVE METABOLIC PANEL
ALK PHOS: 114 U/L (ref 39–117)
ALT: 10 U/L (ref 0–35)
AST: 18 U/L (ref 0–37)
Albumin: 2.8 g/dL — ABNORMAL LOW (ref 3.5–5.2)
Anion gap: 18 — ABNORMAL HIGH (ref 5–15)
BILIRUBIN TOTAL: 0.2 mg/dL — AB (ref 0.3–1.2)
BUN: 8 mg/dL (ref 6–23)
CO2: 16 mEq/L — ABNORMAL LOW (ref 19–32)
Calcium: 9 mg/dL (ref 8.4–10.5)
Chloride: 103 mEq/L (ref 96–112)
Creatinine, Ser: 0.61 mg/dL (ref 0.50–1.10)
GFR calc Af Amer: >90 mL/min (ref >90)
Glucose, Bld: 85 mg/dL (ref 70–99)
POTASSIUM: 3.7 meq/L (ref 3.7–5.3)
Sodium: 137 mEq/L (ref 137–147)
Total Protein: 6.2 g/dL (ref 6.0–8.3)

## 2014-09-09 LAB — URINALYSIS, ROUTINE W REFLEX MICROSCOPIC
Bilirubin Urine: NEGATIVE
GLUCOSE, UA: NEGATIVE mg/dL
KETONES UR: NEGATIVE mg/dL
Nitrite: NEGATIVE
PROTEIN: NEGATIVE mg/dL
Specific Gravity, Urine: 1.01 (ref 1.005–1.030)
UROBILINOGEN UA: 0.2 mg/dL (ref 0.0–1.0)
pH: 6.5 (ref 5.0–8.0)

## 2014-09-09 LAB — PROTEIN / CREATININE RATIO, URINE
Creatinine, Urine: 98.97 mg/dL
Protein Creatinine Ratio: 0.28 — ABNORMAL HIGH (ref 0.00–0.15)
Total Protein, Urine: 27.6 mg/dL

## 2014-09-09 LAB — URINE MICROSCOPIC-ADD ON: RBC / HPF: NONE SEEN RBC/hpf (ref <3)

## 2014-09-09 LAB — URINALYSIS W MICROSCOPIC (NOT AT ARMC)
Bilirubin Urine: NEGATIVE
GLUCOSE, UA: NEGATIVE mg/dL
HGB URINE DIPSTICK: NEGATIVE
Ketones, ur: 15 mg/dL — AB
LEUKOCYTES UA: NEGATIVE
Nitrite: NEGATIVE
PH: 6 (ref 5.0–8.0)
PROTEIN: NEGATIVE mg/dL
RBC / HPF: NONE SEEN RBC/hpf (ref <3)
Specific Gravity, Urine: 1.025 (ref 1.005–1.030)
Urobilinogen, UA: 0.2 mg/dL (ref 0.0–1.0)
WBC UA: NONE SEEN WBC/hpf (ref <3)

## 2014-09-09 LAB — CBC
HEMATOCRIT: 38.2 % (ref 36.0–46.0)
Hemoglobin: 13.3 g/dL (ref 12.0–15.0)
MCH: 32 pg (ref 26.0–34.0)
MCHC: 34.8 g/dL (ref 30.0–36.0)
MCV: 92 fL (ref 78.0–100.0)
Platelets: 147 10*3/uL — ABNORMAL LOW (ref 150–400)
RBC: 4.15 MIL/uL (ref 3.87–5.11)
RDW: 12.8 % (ref 11.5–15.5)
WBC: 14.5 10*3/uL — ABNORMAL HIGH (ref 4.0–10.5)

## 2014-09-09 MED ORDER — CITRIC ACID-SODIUM CITRATE 334-500 MG/5ML PO SOLN: 30.0000 mL | ORAL | Status: DC | PRN

## 2014-09-09 MED ORDER — OXYTOCIN BOLUS FROM INFUSION
500.0000 mL | INTRAVENOUS | Status: DC
  Administered 2014-09-09: 500 mL via INTRAVENOUS

## 2014-09-09 MED ORDER — ZOLPIDEM TARTRATE 5 MG PO TABS: 5.0000 mg | ORAL_TABLET | Freq: Every evening | ORAL | Status: DC | PRN

## 2014-09-09 MED ORDER — OXYCODONE-ACETAMINOPHEN 5-325 MG PO TABS: 1.0000 | ORAL_TABLET | ORAL | Status: DC | PRN

## 2014-09-09 MED ORDER — IBUPROFEN 800 MG PO TABS
800.0000 mg | ORAL_TABLET | Freq: Three times a day (TID) | ORAL | Status: DC | PRN
  Administered 2014-09-09: 800 mg via ORAL
  Filled 2014-09-09: qty 1

## 2014-09-09 MED ORDER — BENZOCAINE-MENTHOL 20-0.5 % EX AERO: 1.0000 "application " | INHALATION_SPRAY | CUTANEOUS | Status: DC | PRN

## 2014-09-09 MED ORDER — ONDANSETRON HCL 4 MG PO TABS: 4.0000 mg | ORAL_TABLET | ORAL | Status: DC | PRN

## 2014-09-09 MED ORDER — OXYCODONE-ACETAMINOPHEN 5-325 MG PO TABS: 2.0000 | ORAL_TABLET | ORAL | Status: DC | PRN

## 2014-09-09 MED ORDER — ACETAMINOPHEN 325 MG PO TABS: 650.0000 mg | ORAL_TABLET | ORAL | Status: DC | PRN

## 2014-09-09 MED ORDER — OXYTOCIN 40 UNITS IN LACTATED RINGERS INFUSION - SIMPLE MED: 62.5000 mL/h | INTRAVENOUS | Status: DC

## 2014-09-09 MED ORDER — FLEET ENEMA 7-19 GM/118ML RE ENEM: 1.0000 | ENEMA | Freq: Every day | RECTAL | Status: DC | PRN

## 2014-09-09 MED ORDER — LIDOCAINE HCL (PF) 1 % IJ SOLN
INTRAMUSCULAR | Status: AC
  Administered 2014-09-09: 30 mL via SUBCUTANEOUS
  Filled 2014-09-09: qty 30

## 2014-09-09 MED ORDER — DIBUCAINE 1 % RE OINT: 1.0000 "application " | TOPICAL_OINTMENT | RECTAL | Status: DC | PRN

## 2014-09-09 MED ORDER — LANOLIN HYDROUS EX OINT: TOPICAL_OINTMENT | CUTANEOUS | Status: DC | PRN

## 2014-09-09 MED ORDER — LACTATED RINGERS IV SOLN: 500.0000 mL | INTRAVENOUS | Status: DC | PRN

## 2014-09-09 MED ORDER — MEASLES, MUMPS & RUBELLA VAC ~~LOC~~ INJ: 0.5000 mL | INJECTION | Freq: Once | SUBCUTANEOUS | Status: DC

## 2014-09-09 MED ORDER — ONDANSETRON HCL 4 MG/2ML IJ SOLN: 4.0000 mg | Freq: Four times a day (QID) | INTRAMUSCULAR | Status: DC | PRN

## 2014-09-09 MED ORDER — SIMETHICONE 80 MG PO CHEW: 80.0000 mg | CHEWABLE_TABLET | ORAL | Status: DC | PRN

## 2014-09-09 MED ORDER — DIPHENHYDRAMINE HCL 25 MG PO CAPS: 25.0000 mg | ORAL_CAPSULE | Freq: Four times a day (QID) | ORAL | Status: DC | PRN

## 2014-09-09 MED ORDER — SENNOSIDES-DOCUSATE SODIUM 8.6-50 MG PO TABS
2.0000 | ORAL_TABLET | ORAL | Status: DC
  Administered 2014-09-09: 2 via ORAL
  Filled 2014-09-09 (×2): qty 2

## 2014-09-09 MED ORDER — FLEET ENEMA 7-19 GM/118ML RE ENEM: 1.0000 | ENEMA | RECTAL | Status: DC | PRN

## 2014-09-09 MED ORDER — BISACODYL 10 MG RE SUPP: 10.0000 mg | Freq: Every day | RECTAL | Status: DC | PRN

## 2014-09-09 MED ORDER — LIDOCAINE HCL (PF) 1 % IJ SOLN
30.0000 mL | INTRAMUSCULAR | Status: DC | PRN
  Administered 2014-09-09: 30 mL via SUBCUTANEOUS
  Filled 2014-09-09: qty 30

## 2014-09-09 MED ORDER — LACTATED RINGERS IV SOLN: INTRAVENOUS | Status: DC

## 2014-09-09 MED ORDER — WITCH HAZEL-GLYCERIN EX PADS: 1.0000 "application " | MEDICATED_PAD | CUTANEOUS | Status: DC | PRN

## 2014-09-09 MED ORDER — OXYTOCIN 40 UNITS IN LACTATED RINGERS INFUSION - SIMPLE MED
INTRAVENOUS | Status: AC
Start: 2014-09-09 — End: 2014-09-10
  Filled 2014-09-09: qty 1000

## 2014-09-09 MED ORDER — ONDANSETRON HCL 4 MG/2ML IJ SOLN: 4.0000 mg | INTRAMUSCULAR | Status: DC | PRN

## 2014-09-09 MED ORDER — PRENATAL MULTIVITAMIN CH
1.0000 | ORAL_TABLET | Freq: Every day | ORAL | Status: DC
  Administered 2014-09-10: 1 via ORAL
  Filled 2014-09-09: qty 1

## 2014-09-09 MED ORDER — TETANUS-DIPHTH-ACELL PERTUSSIS 5-2.5-18.5 LF-MCG/0.5 IM SUSP: 0.5000 mL | Freq: Once | INTRAMUSCULAR | Status: DC

## 2014-09-09 NOTE — Progress Notes (Signed)
Notified pt in MAU via WC, 34 weeks, cerclage in place. Cervix 9 w bulging bag. Pt going down hall now. Dr. Matthew Saras requests bedside u/s to confirm presentation prior to pt being transferred.

## 2014-09-09 NOTE — Progress Notes (Signed)
Notified Dr. Matthew Saras requests bedside u/s to confirm presentation, however 34 wk pt imminently delivering already was moved to room 164. DPoe, CNM going to room 164 to confirm presentation.

## 2014-09-09 NOTE — Plan of Care (Signed)
Problem: Consults Goal: Postpartum Patient Education (See Patient Education module for education specifics.) Outcome: Completed/Met Date Met:  09/09/14 Goal: Skin Care Protocol Initiated - if Braden Score 18 or less If consults are not indicated, leave blank or document N/A Outcome: Not Applicable Date Met:  12/22/79 Goal: Nutrition Consult-if indicated Outcome: Not Applicable Date Met:  18/86/77 Goal: Diabetes Guidelines if Diabetic/Glucose > 140 If diabetic or lab glucose is > 140 mg/dl - Initiate Diabetes/Hyperglycemia Guidelines & Document Interventions  Outcome: Not Applicable Date Met:  37/36/68  Problem: Phase I Progression Outcomes Goal: Pain controlled with appropriate interventions Outcome: Completed/Met Date Met:  09/09/14 Goal: Voiding adequately Outcome: Completed/Met Date Met:  09/09/14 Goal: Foley catheter patent Outcome: Not Applicable Date Met:  15/94/70 Goal: OOB as tolerated unless otherwise ordered Outcome: Completed/Met Date Met:  09/09/14 Goal: IS, TCDB as ordered Outcome: Completed/Met Date Met:  09/09/14 Goal: VS, stable, temp < 100.4 degrees F Outcome: Completed/Met Date Met:  09/09/14 Goal: Initial discharge plan identified Outcome: Completed/Met Date Met:  09/09/14 Goal: Other Phase I Outcomes/Goals Outcome: Completed/Met Date Met:  09/09/14  Problem: Phase II Progression Outcomes Goal: Rh isoimmunization per orders Outcome: Not Applicable Date Met:  76/15/18 Goal: Tolerating diet Outcome: Completed/Met Date Met:  09/09/14

## 2014-09-09 NOTE — H&P (Signed)
  Chief complaint: Labor  History of present illness: 38 year old G2 P0101 patient with a prior low transverse cesarean section has had a cerclage placed since 18 weeks, receiving weekly IM progesterone. She is being followed by ultrasound for a short cervix, currently 34 weeks, began to experience contractions about 1:00 today these increased substantially over the last 2 hours. When she presented to him a you, I was advanced cervical dilatation with bulging membranes, was sent stat to labor and delivery, bedside ultrasound confirmed vertex, I was called and was in route. Her prenatal course, she's had a normal panorama, is known to have a bicornuate uterus.  Past medical history please see the [facility] forms for details.  Physical exam: Temp 98 2, BP 130/82  HEENT: Unremarkable  Lungs: Clear  Cardiovascular: Regular rate and rhythm, without murmurs, rubs, gallops  Abdomen: 32 cm fundal height, fetal heart rate 148  Cervix: LG membranes, completely dilated, cerclage nonpalpable initially  Impression: 34 week IUP, advanced labor, previous cesarean section, cerclage in place.  Plan: Prep for vaginal delivery, NICU to be in attendance.  Dictated with [facility]  Margarette Asal M.D. .

## 2014-09-09 NOTE — Progress Notes (Signed)
Patient ID: [name] Walker, female   DOB: 13-Feb-1976, 38 y.o.   MRN: [id number]   Delivery note  This patient was sent stat to labor and delivery, bedside ultrasound confirmed vertex, she was in attendance by the CNM, I was called and was in route, when I arrived the fetus had delivered, good Apgars, NICU in attendance. I took over at that point. The legs were elevated cord pH was obtained placenta delivered spontaneously intact was sent to pathology. With an assistant, using retractors the cervix could be visualized the cerclage was seen intact in a circle was cut out in the partially torn off one side. Inspection of the cervix revealed no significant laceration or bleeding. There was a second-degree vaginal laceration at the perineum into superficial periurethral lacerations repaired with 3-0 Vicryl repeat sutures.  Mother and baby doing well at that point.  Dictated with [place] GapD.

## 2014-09-10 LAB — CBC
HCT: 33.3 % — ABNORMAL LOW (ref 36.0–46.0)
Hemoglobin: 11.3 g/dL — ABNORMAL LOW (ref 12.0–15.0)
MCH: 31.6 pg (ref 26.0–34.0)
MCHC: 33.9 g/dL (ref 30.0–36.0)
MCV: 93 fL (ref 78.0–100.0)
PLATELETS: 131 10*3/uL — AB (ref 150–400)
RBC: 3.58 MIL/uL — ABNORMAL LOW (ref 3.87–5.11)
RDW: 12.8 % (ref 11.5–15.5)
WBC: 17.6 10*3/uL — AB (ref 4.0–10.5)

## 2014-09-10 LAB — RPR

## 2014-09-10 NOTE — Plan of Care (Signed)
Problem: Phase II Progression Outcomes Goal: Pain controlled on oral analgesia Outcome: Completed/Met Date Met:  09/10/14

## 2014-09-10 NOTE — Plan of Care (Signed)
Problem: Phase II Progression Outcomes Goal: Progress activity as tolerated unless otherwise ordered Outcome: Completed/Met Date Met:  09/10/14 Goal: Afebrile, VS remain stable Outcome: Completed/Met Date Met:  09/10/14 Goal: Incision intact & without signs/symptoms of infection Outcome: Completed/Met Date Met:  09/10/14 Goal: Other Phase II Outcomes/Goals Outcome: Completed/Met Date Met:  09/10/14     

## 2014-09-10 NOTE — Progress Notes (Signed)
CSW attempted to meet with parents in MOB's third floor room/319 to complete assessment due to NICU admission, but they were not in room.  CSW checked at baby's bedside and MOB was feeding baby and welcomed CSW's visit.  CSW met only briefly at this time since CSW did not want to take MOB's focus off of feeding her newborn.  MOB seemed to be in good spirits and enjoying her visit with baby.  Bonding is evident.  MOB and CSW recall meeting when MOB delivered her first baby 2.5 years ago at 94 weeks.  She is hopeful that her new baby will not be here as long as her first was since she made it 2 weeks longer in utero.  MOB states she was hopeful that baby would make it longer, but she was prepared for the possibility of another preterm delivery.  MOB reports having a good support system and no questions, concerns or needs at this time.  CSW asked to follow up at a later time, to which MOB agreed.  MOB thanked CSW for the visit.

## 2014-09-10 NOTE — Lactation Note (Signed)
This note was copied from the chart of [name] Labrittany Wechter. Lactation Consultation Note  Met with mom and FOB in NICU for first feeding assist.  Mom is pumping every 3 hours and obtaining a few mls of colostrum each pumping.  Assisted mom with positioning baby in cross cradle hold on left breast.  Baby opened wide, latched easily and sucked several times before falling asleep.  Finger fed 8 mls to stimulate suck.  Baby took 8 mls and then latched again and nursed actively for 10 minutes.  Mom will give additional 5 mls of colostrum by bottle.  Reviewed preterm feeding norms.  Encouraged to call with concerns/latch assist prn.  Patient Name: [name] Walker VPCHE'K Date: 09/10/2014 Reason for consult: Initial assessment;Infant < 6lbs;NICU baby   Maternal Data    Feeding Feeding Type: Breast Milk Nipple Type: Slow - flow Length of feed: 5 min  LATCH Score/Interventions Latch: Grasps breast easily, tongue down, lips flanged, rhythmical sucking.  Audible Swallowing: A few with stimulation Intervention(s): Skin to skin;Hand expression;Alternate breast massage  Type of Nipple: Everted at rest and after stimulation  Comfort (Breast/Nipple): Soft / non-tender     Hold (Positioning): Assistance needed to correctly position infant at breast and maintain latch. Intervention(s): Breastfeeding basics reviewed;Support Pillows;Position options;Skin to skin  LATCH Score: 8  Lactation Tools Discussed/Used Tools: Other (comment) (curved tip syringe) Pump Review: Setup, frequency, and cleaning Initiated by:: RN Date initiated:: 09/09/14   Consult Status Consult Status: Follow-up Date: 09/11/14 Follow-up type: In-patient    Ave Filter 09/10/2014, 11:56 AM

## 2014-09-11 ENCOUNTER — Encounter (HOSPITAL_COMMUNITY)
Admission: RE | Admit: 2014-09-11 | Discharge: 2014-09-11 | Disposition: A | Discharge status: Home / Self Care | Payer: 59 | Source: Ambulatory Visit | Attending: Cardiovascular Disease | Admitting: Cardiovascular Disease

## 2014-09-11 DIAGNOSIS — O923 Agalactia: Secondary | ICD-10-CM | POA: Diagnosis not present

## 2014-09-11 MED ORDER — BENZONATATE 100 MG PO CAPS
200.0000 mg | ORAL_CAPSULE | Freq: Three times a day (TID) | ORAL | Status: DC
  Administered 2014-09-11: 200 mg via ORAL
  Filled 2014-09-11: qty 2

## 2014-09-11 MED ORDER — BENZONATATE 200 MG PO CAPS: 200.0000 mg | ORAL_CAPSULE | Freq: Three times a day (TID) | ORAL | Status: DC

## 2014-09-11 MED ORDER — AMOXICILLIN-POT CLAVULANATE 875-125 MG PO TABS: 1.0000 | ORAL_TABLET | Freq: Two times a day (BID) | ORAL | Status: DC

## 2014-09-11 MED ORDER — AMOXICILLIN-POT CLAVULANATE 875-125 MG PO TABS
1.0000 | ORAL_TABLET | Freq: Two times a day (BID) | ORAL | Status: DC
  Administered 2014-09-11: 1 via ORAL
  Filled 2014-09-11: qty 1

## 2014-09-11 NOTE — Progress Notes (Signed)
Teaching complete  Pt ambulated out to nicu and will leave after visit to baby

## 2014-09-11 NOTE — Discharge Summary (Signed)
Obstetric Discharge Summary Reason for Admission: onset of labor Prenatal Procedures: ultrasound Intrapartum Procedures: spontaneous vaginal delivery Postpartum Procedures: none Complications-Operative and Postpartum: 2 degree perineal laceration HEMOGLOBIN  Date Value Ref Range Status  09/10/2014 11.3* 12.0 - 15.0 g/dL Final   HCT  Date Value Ref Range Status  09/10/2014 33.3* 36.0 - 46.0 % Final    Physical Exam:  General: alert and cooperative Lochia: appropriate Uterine Fundus: firm Incision: healing well DVT Evaluation: No evidence of DVT seen on physical exam. Negative Homan's sign. No cords or calf tenderness. No significant calf/ankle edema.  Discharge Diagnoses: Term Pregnancy-delivered  Discharge Information: Date: 09/11/2014 Activity: pelvic rest Diet: routine Medications: PNV and Augmentin and Tessalon Condition: stable Instructions: refer to practice specific booklet Discharge to: home   Newborn Data: Live born female  Birth Weight: 4 lb 7.6 oz (2030 g) APGAR: 8, 9  Home with NICU.  Kael Keetch G 09/11/2014, 8:51 AM

## 2014-09-11 NOTE — Lactation Note (Signed)
This note was copied from the chart of [name] Walker. Lactation Consultation Note  Patient Name: [name] Walker SFSEL'T Date: 09/11/2014   [employer] pump rental completed, teaching on use and care of pump.  Mom knows importance of pumping 8-12 times/24 hrs.  Engorgement prevention and treatment discussed.  Reviewed OP lactation services available to Mom.  Encouraged her to call prn for assistance with latch in NICU and at home after discharge.     Broadus John 09/11/2014, 11:46 AM

## 2014-09-11 NOTE — Progress Notes (Signed)
Post Partum Day 2 Subjective: up ad lib, voiding, tolerating PO and c/o productive cough  Objective: Blood pressure 117/75, pulse 87, temperature 98.4 F (36.9 C), temperature source Oral, resp. rate 18, height 5' 10"  (1.778 m), weight 130 lb (58.968 kg), SpO2 99 %, unknown if currently breastfeeding.  Physical Exam:  General: alert and cooperative Lochia: appropriate Uterine Fundus: firm, uterus is bicornate , firmer on r side Incision: healing well DVT Evaluation: No evidence of DVT seen on physical exam. Negative Homan's sign. No cords or calf tenderness. No significant calf/ankle edema.   Recent Labs  09/09/14 1848 09/10/14 0536  HGB 13.3 11.3*  HCT 38.2 33.3*    Assessment/Plan: Discharge home   Augmentin 875 mg bid Tessalon Pearles 200 mg TID   LOS: 2 days   Grier Vu G 09/11/2014, 8:43 AM

## 2014-09-13 ENCOUNTER — Ambulatory Visit: Payer: Self-pay

## 2014-09-13 NOTE — Lactation Note (Signed)
This note was copied from the chart of [name] Walker. Lactation Consultation Note  Follow up visit with mom in NICU.  She is not putting baby to breast because of needing to know volume baby takes.  Explained that we could allow baby to go to breast for a short time before bottle.  It will be important to observe baby for becoming tired at breast to allow for adequate volume needed per bottle.  Mom states pumping is going well and she last obtained 60 mls.  Encouraged to call with concerns/assist.   Patient Name: [name] Walker HAFBX'U Date: 09/13/2014     Maternal Data    Feeding Feeding Type: Breast Milk Nipple Type: Slow - flow Length of feed: 15 min  LATCH Score/Interventions                      Lactation Tools Discussed/Used     Consult Status      Ave Filter 09/13/2014, 11:18 AM

## 2014-09-14 ENCOUNTER — Ambulatory Visit: Payer: Self-pay

## 2014-09-14 NOTE — Lactation Note (Signed)
This note was copied from the chart of [name] Earline Stiner. Lactation Consultation Note        Follow up consult with this mom and baby, now 10 days old, and 35 weeks CGA. Mom put [name] to the breast for the first time. She latched well, with strong suckles. [name] was skin to skin by the time i arrived, but mom feels she may have transferred some milk. Mom very motivated to breast feed this baby. She breast fed her first, who was a [redacted] week gestation baby, for 9 months, and would like to go even longer with [name], if possible.   Patient Name: [name] Walker HQION'G Date: 09/14/2014 Reason for consult: Follow-up assessment;NICU baby;Late preterm infant   Maternal Data    Feeding Feeding Type: Breast Milk Nipple Type: Slow - flow Length of feed: 30 min  LATCH Score/Interventions Latch: Grasps breast easily, tongue down, lips flanged, rhythmical sucking.  Audible Swallowing: Spontaneous and intermittent  Type of Nipple: Everted at rest and after stimulation  Comfort (Breast/Nipple): Soft / non-tender     Hold (Positioning): No assistance needed to correctly position infant at breast.  LATCH Score: 10  Lactation Tools Discussed/Used     Consult Status Consult Status: Follow-up Follow-up type: In-patient (NICU)    Tonna Corner 09/14/2014, 4:23 PM

## 2014-10-11 ENCOUNTER — Encounter (HOSPITAL_COMMUNITY)
Admission: RE | Admit: 2014-10-11 | Discharge: 2014-10-11 | Disposition: A | Discharge status: Home / Self Care | Payer: 59 | Source: Ambulatory Visit | Attending: Cardiovascular Disease | Admitting: Cardiovascular Disease

## 2014-10-11 DIAGNOSIS — O923 Agalactia: Secondary | ICD-10-CM | POA: Diagnosis present

## 2014-10-16 ENCOUNTER — Encounter (HOSPITAL_COMMUNITY): Admission: AD | Payer: Managed Care, Other (non HMO) | Source: Ambulatory Visit

## 2014-10-16 SURGERY — Surgical Case
Anesthesia: Regional

## 2014-10-18 ENCOUNTER — Other Ambulatory Visit: Payer: Self-pay | Admitting: Obstetrics and Gynecology

## 2014-10-19 LAB — CYTOLOGY - PAP

## 2014-10-28 ENCOUNTER — Inpatient Hospital Stay (HOSPITAL_COMMUNITY): Admission: AD | Admit: 2014-10-28 | Payer: 59 | Source: Ambulatory Visit | Admitting: Obstetrics and Gynecology

## 2014-10-28 ENCOUNTER — Inpatient Hospital Stay (HOSPITAL_COMMUNITY)
Admission: AD | Admit: 2014-10-28 | Discharge: 2014-10-28 | Disposition: A | Discharge status: Home / Self Care | Payer: 59 | Source: Ambulatory Visit | Attending: Obstetrics and Gynecology | Admitting: Obstetrics and Gynecology

## 2014-10-28 ENCOUNTER — Encounter (HOSPITAL_COMMUNITY): Payer: Self-pay | Admitting: *Deleted

## 2014-10-28 DIAGNOSIS — N920 Excessive and frequent menstruation with regular cycle: Secondary | ICD-10-CM | POA: Insufficient documentation

## 2014-10-28 DIAGNOSIS — N924 Excessive bleeding in the premenopausal period: Secondary | ICD-10-CM

## 2014-10-28 DIAGNOSIS — N939 Abnormal uterine and vaginal bleeding, unspecified: Secondary | ICD-10-CM | POA: Diagnosis present

## 2014-10-28 LAB — CBC
HCT: 36.7 % (ref 36.0–46.0)
Hemoglobin: 12.2 g/dL (ref 12.0–15.0)
MCH: 30.9 pg (ref 26.0–34.0)
MCHC: 33.2 g/dL (ref 30.0–36.0)
MCV: 92.9 fL (ref 78.0–100.0)
Platelets: 153 10*3/uL (ref 150–400)
RBC: 3.95 MIL/uL (ref 3.87–5.11)
RDW: 12.1 % (ref 11.5–15.5)
WBC: 5.8 10*3/uL (ref 4.0–10.5)

## 2014-10-28 LAB — URINE MICROSCOPIC-ADD ON

## 2014-10-28 LAB — TYPE AND SCREEN
ABO/RH(D): O POS
Antibody Screen: NEGATIVE

## 2014-10-28 LAB — URINALYSIS, ROUTINE W REFLEX MICROSCOPIC
Bilirubin Urine: NEGATIVE
Glucose, UA: NEGATIVE mg/dL
KETONES UR: NEGATIVE mg/dL
LEUKOCYTES UA: NEGATIVE
Nitrite: NEGATIVE
PH: 6 (ref 5.0–8.0)
Protein, ur: NEGATIVE mg/dL
Specific Gravity, Urine: >1.03 — ABNORMAL HIGH (ref 1.005–1.030)
Urobilinogen, UA: 0.2 mg/dL (ref 0.0–1.0)

## 2014-10-28 LAB — POCT PREGNANCY, URINE: PREG TEST UR: NEGATIVE

## 2014-10-28 NOTE — Discharge Instructions (Signed)
Abnormal Uterine Bleeding Abnormal uterine bleeding can affect women at various stages in life, including teenagers, women in their reproductive years, pregnant women, and women who have reached menopause. Several kinds of uterine bleeding are considered abnormal, including:  Bleeding or spotting between periods.   Bleeding after sexual intercourse.   Bleeding that is heavier or more than normal.   Periods that last longer than usual.  Bleeding after menopause.  Many cases of abnormal uterine bleeding are minor and simple to treat, while others are more serious. Any type of abnormal bleeding should be evaluated by your health care provider. Treatment will depend on the cause of the bleeding. HOME CARE INSTRUCTIONS Monitor your condition for any changes. The following actions may help to alleviate any discomfort you are experiencing:  Avoid the use of tampons and douches as directed by your health care provider.  Change your pads frequently. You should get regular pelvic exams and Pap tests. Keep all follow-up appointments for diagnostic tests as directed by your health care provider.  SEEK MEDICAL CARE IF:   Your bleeding lasts more than 1 week.   You feel dizzy at times.  SEEK IMMEDIATE MEDICAL CARE IF:   You pass out.   You are changing pads every 15 to 30 minutes.   You have abdominal pain.  You have a fever.   You become sweaty or weak.   You are passing large blood clots from the vagina.   You start to feel nauseous and vomit. MAKE SURE YOU:   Understand these instructions.  Will watch your condition.  Will get help right away if you are not doing well or get worse. Document Released: 09/28/2005 Document Revised: 10/03/2013 Document Reviewed: 04/27/2013 [facility] Patient Information 2015 [place], [place]. This information is not intended to replace advice given to you by your health care provider. Make sure you discuss any questions you have with your  health care provider.

## 2014-10-28 NOTE — MAU Note (Addendum)
Patient presents with complaint of heavy vaginal bleeding X 1 week. States that the bleeding worsened and developed clots yesterday and that she is saturating 3 pads an hour now. States she delivered vaginally on 11/29 and is breastfeeding. Was prescribed ortho micronor BCP's that she started taking last Sunday and that the bleeding started after taking the medication.

## 2014-10-28 NOTE — MAU Provider Note (Signed)
CSN: [id number]     Arrival date & time 10/28/14  1626 History   None    Chief Complaint  Patient presents with  . Vaginal Bleeding     (Consider location/radiation/quality/duration/timing/severity/associated sxs/prior Treatment) HPI [name] Walker is a 39 y.o. [id number]. She had VBAC SVD 11/29 with an intact cerclage due to fast labor. She started Micronor 1 wk ago, started vaginal bleeding the next day. It has been like a heavy period until yesterday. Yesterday she started cramping and her bleeding increased with 2-3 cm clots. This afternoon she changed a maxi pad 3x/hr, with clots and cramping. This is first menes since delivery, no sexual activity yet. She has bicornuate uterus. Past Medical History  Diagnosis Date  . Bicornate uterus   . Anxiety   . Abnormal Pap smear   . Hx LEEP (loop electrosurgical excision procedure), cervix, pregnancy   . TMJ (temporomandibular joint syndrome)    Past Surgical History  Procedure Laterality Date  . Cesarean section  12/04/2011    Procedure: CESAREAN SECTION;  Surgeon: Cyril Mourning, MD;  Location: [facility];  Service: Gynecology;  Laterality: N/A;  . Cervical cerclage N/A 04/05/2014    Procedure: CERCLAGE CERVICAL;  Surgeon: Cyril Mourning, MD;  Location: [facility];  Service: Gynecology;  Laterality: N/A;   Family History  Problem Relation Age of Onset  . Arthritis Mother   . Cancer Mother   . Hyperlipidemia Mother   . Hypertension Mother   . Migraines Mother   . Breast cancer Mother 72  . Cancer Father   . Depression Brother   . Cancer Maternal Aunt   . Breast cancer Maternal Aunt   . Ovarian cancer Maternal Aunt   . Cancer Maternal Grandmother   . Hypertension Maternal Grandmother   . Leukemia Maternal Grandmother   . Cancer Maternal Grandfather   . Anesthesia problems Neg Hx   . Hypotension Neg Hx   . Malignant hyperthermia Neg Hx   . Pseudochol deficiency Neg Hx    History  Substance Use Topics  . Smoking status: Former  Smoker    Quit date: 10/12/1996  . Smokeless tobacco: Never Used  . Alcohol Use: No   OB History    Gravida Para Term Preterm AB TAB SAB Ectopic Multiple Living   2 2 0 2 0 0 0 0 0 2      Review of Systems  Constitutional: Negative for fever and chills.  Gastrointestinal: Negative for nausea, vomiting, diarrhea and constipation.  Genitourinary: Positive for vaginal bleeding and pelvic pain (cramping). Negative for dysuria, urgency, frequency and vaginal discharge.      Allergies  Sulfa antibiotics  Home Medications   Prior to Admission medications   Medication Sig Start Date End Date Taking? Authorizing Provider  amoxicillin-clavulanate (AUGMENTIN) 875-125 MG per tablet Take 1 tablet by mouth every 12 (twelve) hours. 09/11/14   Damien Fusi, NP  benzonatate (TESSALON) 200 MG capsule Take 1 capsule (200 mg total) by mouth 3 (three) times daily. 09/11/14   Damien Fusi, NP  Prenatal Vit-Fe Fumarate-FA (PRENATAL MULTIVITAMIN) TABS Take 1 tablet by mouth daily.    Historical Provider, MD   BP 114/81 mmHg  Pulse 86  Temp(Src) 97.7 F (36.5 C) (Oral)  Resp 16  Ht 5' 10"  (1.778 m)  Wt 47.741 kg (105 lb 4 oz)  BMI 15.10 kg/m2 Physical Exam  Constitutional: She is oriented to person, place, and time. She appears well-developed and well-nourished.  Abdominal: Soft. There is no  tenderness.  Genitourinary:  Pelvic exam- Ext gen- nl anatomy, skin intact Vagina- mod amt bright red blood with BB sized clots Cx-S/P LEEP and cerclage-parous Uterus- non tender, sl enlarged  Adn- no masses palp, non tender  Musculoskeletal: Normal range of motion.  Neurological: She is alert and oriented to person, place, and time.  Skin: Skin is warm and dry.  Psychiatric: She has a normal mood and affect. Her behavior is normal.    ED Course  Procedures (including critical care time) Labs Review Labs Reviewed  URINALYSIS, ROUTINE W REFLEX MICROSCOPIC  CBC  POCT PREGNANCY, URINE  TYPE AND  SCREEN   Results for orders placed or performed during the hospital encounter of 10/28/14 (from the past 24 hour(s))  Urinalysis, Routine w reflex microscopic     Status: Abnormal   Collection Time: 10/28/14  4:35 PM  Result Value Ref Range   Color, Urine YELLOW YELLOW   APPearance HAZY (A) CLEAR   Specific Gravity, Urine >1.030 (H) 1.005 - 1.030   pH 6.0 5.0 - 8.0   Glucose, UA NEGATIVE NEGATIVE mg/dL   Hgb urine dipstick LARGE (A) NEGATIVE   Bilirubin Urine NEGATIVE NEGATIVE   Ketones, ur NEGATIVE NEGATIVE mg/dL   Protein, ur NEGATIVE NEGATIVE mg/dL   Urobilinogen, UA 0.2 0.0 - 1.0 mg/dL   Nitrite NEGATIVE NEGATIVE   Leukocytes, UA NEGATIVE NEGATIVE  Urine microscopic-add on     Status: Abnormal   Collection Time: 10/28/14  4:35 PM  Result Value Ref Range   Squamous Epithelial / LPF FEW (A) RARE   WBC, UA 0-2 <3 WBC/hpf   RBC / HPF 21-50 <3 RBC/hpf   Bacteria, UA FEW (A) RARE  Pregnancy, urine POC     Status: None   Collection Time: 10/28/14  5:05 PM  Result Value Ref Range   Preg Test, Ur NEGATIVE NEGATIVE  CBC     Status: None   Collection Time: 10/28/14  5:20 PM  Result Value Ref Range   WBC 5.8 4.0 - 10.5 K/uL   RBC 3.95 3.87 - 5.11 MIL/uL   Hemoglobin 12.2 12.0 - 15.0 g/dL   HCT 36.7 36.0 - 46.0 %   MCV 92.9 78.0 - 100.0 fL   MCH 30.9 26.0 - 34.0 pg   MCHC 33.2 30.0 - 36.0 g/dL   RDW 12.1 11.5 - 15.5 %   Platelets 153 150 - 400 K/uL  Type and screen     Status: None   Collection Time: 10/28/14  5:20 PM  Result Value Ref Range   ABO/RH(D) O POS    Antibody Screen NEG    Sample Expiration 10/31/2014      Imaging Review No results found.   EKG Interpretation None      MDM  First menses since delivery, Hgb 12.2, neg orthostatics Final diagnoses:  None    A- Menorrhagia, on POP  P- Continue POP Precautions reviewed- cll the office with increased bleeding, fever or dizziness/fainting Call the office tomorrow to be seen Consulted with Dr  Julien Girt

## 2014-11-12 ENCOUNTER — Encounter (HOSPITAL_COMMUNITY)
Admission: RE | Admit: 2014-11-12 | Discharge: 2014-11-12 | Disposition: A | Discharge status: Home / Self Care | Payer: 59 | Source: Ambulatory Visit | Attending: Cardiovascular Disease | Admitting: Cardiovascular Disease

## 2014-11-12 DIAGNOSIS — O923 Agalactia: Secondary | ICD-10-CM | POA: Insufficient documentation

## 2014-12-12 ENCOUNTER — Encounter (HOSPITAL_COMMUNITY)
Admission: RE | Admit: 2014-12-12 | Discharge: 2014-12-12 | Disposition: A | Discharge status: Home / Self Care | Payer: 59 | Source: Ambulatory Visit | Attending: Cardiovascular Disease | Admitting: Cardiovascular Disease

## 2014-12-12 DIAGNOSIS — O923 Agalactia: Secondary | ICD-10-CM | POA: Insufficient documentation

## 2015-01-12 ENCOUNTER — Encounter (HOSPITAL_COMMUNITY)
Admission: RE | Admit: 2015-01-12 | Discharge: 2015-01-12 | Disposition: A | Discharge status: Home / Self Care | Payer: 59 | Source: Ambulatory Visit | Attending: Cardiovascular Disease | Admitting: Cardiovascular Disease

## 2015-01-12 DIAGNOSIS — O923 Agalactia: Secondary | ICD-10-CM | POA: Insufficient documentation

## 2015-01-21 ENCOUNTER — Ambulatory Visit (INDEPENDENT_AMBULATORY_CARE_PROVIDER_SITE_OTHER): Payer: 59 | Admitting: Internal Medicine

## 2015-01-21 VITALS — BP 104/58 | HR 67 | Temp 97.7°F | Resp 16 | Ht 70.0 in | Wt 115.0 lb

## 2015-01-21 DIAGNOSIS — H00036 Abscess of eyelid left eye, unspecified eyelid: Secondary | ICD-10-CM | POA: Diagnosis not present

## 2015-01-21 MED ORDER — CEPHALEXIN 250 MG PO CAPS: 250.0000 mg | ORAL_CAPSULE | Freq: Three times a day (TID) | ORAL | Status: DC

## 2015-01-21 NOTE — Progress Notes (Signed)
° °  Subjective:    Patient ID: [name] Walker, female    DOB: [birth year], 39 y.o.   MRN: [id number]  This chart was scribed for Tami Lin, MD by Stephania Fragmin, ED Scribe. This patient was seen in room 14 and the patient's care was started at 10:19 AM.   Chief Complaint  Patient presents with   Eye Pain    Left eye/ onset 3 days ago    HPI  HPI Comments: [name] Walker is a 39 y.o. female who presents to the [facility] complaining of a constant red, swollen bump on her left lower eyelid that appeared 3 days ago. The same bump started out on her left upper eyelid about 1 month ago, resolved, and re-appeared on the lower eyelid of the same eye. She seems to routinely gets these infections annually, and wonders how to prevent them. Patient is currently breastfeeding for her daughter, who is 51.5 months old; her daughter is doing well breastfeeding. Patient denies eye itching or sneezing. Patient is a Licensed conveyancer. She has NKDA.  Review of Systems  HENT: Negative for sneezing.   Eyes: Negative for itching.       Objective:   Physical Exam  Constitutional: She is oriented to person, place, and time. She appears well-developed and well-nourished. No distress.  Eyes: Conjunctivae and EOM are normal. Pupils are equal, round, and reactive to light.  Left lower lid is red and swollen, with a pointing abscess near the medial canthus.  Neck: Neck supple.  Cardiovascular: Normal rate.   Pulmonary/Chest: Effort normal.  Neurological: She is alert and oriented to person, place, and time. No cranial nerve deficit.  Psychiatric: She has a normal mood and affect.  Nursing note and vitals reviewed.      Assessment & Plan:   I have completed the patient encounter in its entirety as documented by the scribe, with editing by me where necessary. Robert P. Laney Pastor, M.D. Cellulitis of eyelid, left  Meds ordered this encounter  Medications   cephALEXin (KEFLEX) 250 MG  capsule    Sig: Take 1 capsule (250 mg total) by mouth 3 (three) times daily.    Dispense:  21 capsule    Refill:  0   Hot compr tid

## 2015-02-11 ENCOUNTER — Encounter (HOSPITAL_COMMUNITY)
Admission: RE | Admit: 2015-02-11 | Discharge: 2015-02-11 | Disposition: A | Discharge status: Home / Self Care | Payer: 59 | Source: Ambulatory Visit | Attending: Cardiovascular Disease | Admitting: Cardiovascular Disease

## 2015-02-11 DIAGNOSIS — O923 Agalactia: Secondary | ICD-10-CM | POA: Insufficient documentation

## 2015-03-14 ENCOUNTER — Encounter (HOSPITAL_COMMUNITY)
Admission: RE | Admit: 2015-03-14 | Discharge: 2015-03-14 | Disposition: A | Discharge status: Home / Self Care | Payer: Managed Care, Other (non HMO) | Source: Ambulatory Visit | Attending: Cardiovascular Disease | Admitting: Cardiovascular Disease

## 2015-03-14 DIAGNOSIS — O923 Agalactia: Secondary | ICD-10-CM | POA: Insufficient documentation

## 2015-04-13 ENCOUNTER — Encounter (HOSPITAL_COMMUNITY): Payer: Managed Care, Other (non HMO) | Attending: Cardiovascular Disease

## 2015-04-13 DIAGNOSIS — O923 Agalactia: Secondary | ICD-10-CM | POA: Insufficient documentation

## 2015-05-03 ENCOUNTER — Ambulatory Visit (INDEPENDENT_AMBULATORY_CARE_PROVIDER_SITE_OTHER): Payer: 59 | Admitting: Physician Assistant

## 2015-05-03 VITALS — BP 118/60 | HR 112 | Temp 101.2°F | Resp 16 | Ht 70.0 in | Wt 113.2 lb

## 2015-05-03 DIAGNOSIS — J029 Acute pharyngitis, unspecified: Secondary | ICD-10-CM

## 2015-05-03 LAB — POCT RAPID STREP A (OFFICE): Rapid Strep A Screen: NEGATIVE

## 2015-05-03 MED ORDER — AMOXICILLIN 500 MG PO TABS: 500.0000 mg | ORAL_TABLET | Freq: Two times a day (BID) | ORAL | Status: DC

## 2015-05-03 NOTE — Progress Notes (Signed)
   Subjective:    Patient ID: [name] Walker, female    DOB: Oct 18, 1975, 39 y.o.   MRN: [id number]  HPI Woke up yesterday with sore throat, achiness, chills, hot flashes, night sweats. Took tylenol, as she is still breast feeding. reports anterior cervical lymph swelling. Pt reports feeling worse today. Took temp at home and it was 100.86F. Denies N/V/D. Pt reports decreased appetite today. Pt states she has been drinking plenty of fluids. 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    Review of Systems  Constitutional: Positive for fever, chills, diaphoresis, appetite change and fatigue.  HENT: Positive for sore throat and trouble swallowing (secondary to throat pain).   Eyes: Negative.   Respiratory: Negative.   Cardiovascular: Negative.   Gastrointestinal: Negative.   Musculoskeletal: Positive for myalgias.  Skin: Negative.   Hematological: Positive for adenopathy (anterior cervical chain ).       Objective:   Physical Exam  Constitutional: She is oriented to person, place, and time. She appears well-developed and well-nourished. No distress.  HENT:  Head: Normocephalic and atraumatic.  Nose: Nose normal.  Mouth/Throat: No oropharyngeal exudate.  Clear fluid behind TM BL, no exudate or bulging TM.  Neck: Normal range of motion. Neck supple. No tracheal deviation present. No thyromegaly present.  Cardiovascular: Regular rhythm, normal heart sounds and intact distal pulses.  Exam reveals no gallop and no friction rub.   No murmur heard. Pulmonary/Chest: Effort normal and breath sounds normal. No respiratory distress. She has no wheezes. She has no rales.  Musculoskeletal: Normal range of motion. She exhibits no edema.  Lymphadenopathy:    She has cervical adenopathy (left anterior).  Neurological: She is alert and oriented to person, place, and time.  Skin: Skin is warm and dry. No  rash noted. She is not diaphoretic. No erythema. No pallor.  Psychiatric: She has a normal mood and affect. Her behavior is normal. Judgment and thought content normal.     Results for orders placed or performed in visit on 05/03/15  POCT rapid strep A  Result Value Ref Range   Rapid Strep A Screen Negative Negative       Assessment & Plan:  1. Sore throat - POCT rapid strep A - Culture, Group A Strep - Rapid GAS was negative but suspicion for GAS pharyngitis is still very high due to the patients clinical picture of fever, tachycardia, sore throat, and anterior cervical lymphadenopathy. -AmoxicillinAMOXIL) 500 MG tablet; Take 1 tablet (500 mg total) by mouth 2 (two) times daily.  Dispense: 20 tablet; Refill: 0

## 2015-05-03 NOTE — Progress Notes (Signed)
05/03/2015 at 6:28 PM  [name] Walker / DOB: 12-22-1975 / MRN: [id number]  The patient has PVC (premature ventricular contraction) on her problem list.  SUBJECTIVE  Chief complaint: Fever and Sore Throat  [name] Walker is a 39 y.o. breast feeding febrile female here today for sore throat and fever that started yesterday morning. She reports her sore throat is worse today. Associates some anterior neck pain with the sore throat and fever.    She  has a past medical history of Bicornate uterus; Anxiety; Abnormal Pap smear; LEEP (loop electrosurgical excision procedure), cervix, pregnancy; and TMJ (temporomandibular joint syndrome).    Medications reviewed and updated by myself where necessary, and exist elsewhere in the encounter.   [name] is allergic to sulfa antibiotics. She  reports that she quit smoking about 18 years ago. She has never used smokeless tobacco. She reports that she does not drink alcohol or use illicit drugs. She  reports that she currently engages in sexual activity. She reports using the following method of birth control/protection: None. The patient  has past surgical history that includes Cesarean section (12/04/2011) and Cervical cerclage (N/A, 04/05/2014).  Her family history includes Arthritis in her mother; Breast cancer in her maternal aunt; Breast cancer (age of onset: 16) in her mother; Cancer in her father, maternal aunt, maternal grandfather, maternal grandmother, and mother; Depression in her brother; Hyperlipidemia in her mother; Hypertension in her maternal grandmother and mother; Leukemia in her maternal grandmother; Migraines in her mother; Ovarian cancer in her maternal aunt. There is no history of Anesthesia problems, Hypotension, Malignant hyperthermia, or Pseudochol deficiency.  Review of Systems  Constitutional: Positive for fever and chills.  Eyes: Negative.   Respiratory: Negative for cough.   Cardiovascular: Negative.   Gastrointestinal:  Negative for nausea, vomiting and abdominal pain.  Skin: Negative for itching and rash.  Neurological: Negative for dizziness and headaches.    OBJECTIVE  Her  height is 5' 10"  (1.778 m) and weight is 113 lb 3.2 oz (51.347 kg). Her oral temperature is 101.2 F (38.4 C). Her blood pressure is 118/60 and her pulse is 112. Her respiration is 16 and oxygen saturation is 98%.  The patient's body mass index is 16.24 kg/(m^2).  Physical Exam  Vitals reviewed. Constitutional: She is oriented to person, place, and time.  HENT:  Right Ear: Hearing and tympanic membrane normal.  Left Ear: Hearing and tympanic membrane normal.  Nose: Nose normal.  Mouth/Throat: Uvula is midline, oropharynx is clear and moist and mucous membranes are normal.    Cardiovascular: Tachycardia present.   Respiratory: Effort normal and breath sounds normal.  GI: Soft.  Musculoskeletal: Normal range of motion.  Neurological: She is alert and oriented to person, place, and time. No cranial nerve deficit.  Skin: Skin is warm and dry. No rash noted. No pallor.  Psychiatric: She has a normal mood and affect.    Results for orders placed or performed in visit on 05/03/15 (from the past 24 hour(s))  POCT rapid strep A     Status: None   Collection Time: 05/03/15  6:09 PM  Result Value Ref Range   Rapid Strep A Screen Negative Negative    ASSESSMENT & PLAN  [name] was seen today for fever and sore throat.  Diagnoses and all orders for this visit:  Sore throat: Breast feeding mother with fever, sore throat, anterior lymphadenopathy, and lack of cough. Denies coryza. Rapid strep negative, culture is out.  Will treat empirically for now.  She was excused from work Architectural technologist. Advised that she RTC if she is no better in 48 hours.  Orders: -     POCT rapid strep A -     Culture, Group A Strep -     amoxicillin (AMOXIL) 500 MG tablet; Take 1 tablet (500 mg total) by mouth 2 (two) times daily.    The patient was advised  to call or come back to clinic if she does not see an improvement in symptoms, or worsens with the above plan.   Philis Fendt, MHS, PA-C [facility] 05/03/2015 6:28 PM

## 2015-05-05 ENCOUNTER — Ambulatory Visit (INDEPENDENT_AMBULATORY_CARE_PROVIDER_SITE_OTHER): Payer: 59 | Admitting: Emergency Medicine

## 2015-05-05 VITALS — BP 98/65 | HR 99 | Temp 100.4°F | Resp 17 | Ht 70.0 in | Wt 113.0 lb

## 2015-05-05 DIAGNOSIS — J029 Acute pharyngitis, unspecified: Secondary | ICD-10-CM

## 2015-05-05 DIAGNOSIS — R509 Fever, unspecified: Secondary | ICD-10-CM

## 2015-05-05 LAB — POCT CBC
GRANULOCYTE PERCENT: 71.8 % (ref 37–80)
HCT, POC: 41.2 % (ref 37.7–47.9)
Hemoglobin: 13.6 g/dL (ref 12.2–16.2)
LYMPH, POC: 2.1 (ref 0.6–3.4)
MCH, POC: 29.5 pg (ref 27–31.2)
MCHC: 33 g/dL (ref 31.8–35.4)
MCV: 89.4 fL (ref 80–97)
MID (CBC): 1 — AB (ref 0–0.9)
MPV: 9.5 fL (ref 0–99.8)
PLATELET COUNT, POC: 192 10*3/uL (ref 142–424)
POC GRANULOCYTE: 7.8 — AB (ref 2–6.9)
POC LYMPH %: 19.2 % (ref 10–50)
POC MID %: 9 % (ref 0–12)
RBC: 4.61 M/uL (ref 4.04–5.48)
RDW, POC: 12.5 %
WBC: 10.8 10*3/uL — AB (ref 4.6–10.2)

## 2015-05-05 LAB — CULTURE, GROUP A STREP: Organism ID, Bacteria: NORMAL

## 2015-05-05 MED ORDER — AMOXICILLIN-POT CLAVULANATE 875-125 MG PO TABS: 1.0000 | ORAL_TABLET | Freq: Two times a day (BID) | ORAL | Status: DC

## 2015-05-05 NOTE — Progress Notes (Addendum)
Patient ID: [name] Walker, female   DOB: 02/03/1976, 39 y.o.   MRN: [id number]    This chart was scribed for Nena Jordan, MD by [facility], medical scribe at [facility].The patient was seen in exam room 10 and the patient's care was started at 3:50 PM.  Chief Complaint:  Chief Complaint  Patient presents with  . Fever  . Sore Throat   HPI: [name] Walker is a 39 y.o. female who reports to [facility] today complaining of a persistent fever and sore throat. Pt was seen two days ago for this complaint. Strep test was negative and she was treated with amoxicillin without improvement.  Yesterday she had a fever of 102. She also complains of chills, myalgias, arthralgias. Her sore throat has worsened. She has two children neither have been sick. 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 She denies headache, back pain  Past Medical History  Diagnosis Date  . Bicornate uterus   . Anxiety   . Abnormal Pap smear   . Hx LEEP (loop electrosurgical excision procedure), cervix, pregnancy   . TMJ (temporomandibular joint syndrome)    Past Surgical History  Procedure Laterality Date  . Cesarean section  12/04/2011    Procedure: CESAREAN SECTION;  Surgeon: Cyril Mourning, MD;  Location: [facility];  Service: Gynecology;  Laterality: N/A;  . Cervical cerclage N/A 04/05/2014    Procedure: CERCLAGE CERVICAL;  Surgeon: Cyril Mourning, MD;  Location: [facility];  Service: Gynecology;  Laterality: N/A;   History   Social History  . Marital Status: Married    Spouse Name: N/A  . Number of Children: 1  . Years of Education: N/A   Occupational History  .  Unemployed   Social History Main Topics  . Smoking status: Former Smoker    Quit date: 10/12/1996  . Smokeless tobacco: Never Used  . Alcohol Use: No  . Drug Use: No  . Sexual Activity: Yes    Birth Control/ Protection: None   Other Topics Concern  . None   Social History Narrative   Family History  Problem  Relation Age of Onset  . Arthritis Mother   . Cancer Mother   . Hyperlipidemia Mother   . Hypertension Mother   . Migraines Mother   . Breast cancer Mother 70  . Cancer Father   . Depression Brother   . Cancer Maternal Aunt   . Breast cancer Maternal Aunt   . Ovarian cancer Maternal Aunt   . Cancer Maternal Grandmother   . Hypertension Maternal Grandmother   . Leukemia Maternal Grandmother   . Cancer Maternal Grandfather   . Anesthesia problems Neg Hx   . Hypotension Neg Hx   . Malignant hyperthermia Neg Hx   . Pseudochol deficiency Neg Hx    Allergies  Allergen Reactions  . Sulfa Antibiotics Rash   Prior to Admission medications   Medication Sig Start Date End Date Taking? Authorizing Provider  acetaminophen (TYLENOL) 325 MG tablet Take 325 mg by mouth every 6 (six) hours as needed for mild pain.   Yes Historical Provider, MD  amoxicillin (AMOXIL) 500 MG tablet Take 1 tablet (500 mg total) by mouth 2 (two) times daily. 05/03/15  Yes Tereasa Coop, PA-C  Norethindrone (ORTHO MICRONOR PO) Take 1 tablet by mouth daily.   Yes Historical Provider, MD  Prenatal Vit-Fe Fumarate-FA (PRENATAL MULTIVITAMIN) TABS Take 1 tablet by mouth daily.   Yes Historical Provider, MD  cephALEXin [facility])  250 MG capsule Take 1 capsule (250 mg total) by mouth 3 (three) times daily. Patient not taking: Reported on 05/03/2015 01/21/15   Leandrew Koyanagi, MD  ibuprofen (ADVIL,MOTRIN) 200 MG tablet Take 200 mg by mouth every 6 (six) hours as needed for mild pain.    Historical Provider, MD   ROS: The patient denies night sweats, unintentional weight loss, chest pain, palpitations, wheezing, dyspnea on exertion, nausea, vomiting, abdominal pain, dysuria, hematuria, melena, numbness, weakness, or tingling.   All other systems have been reviewed and were otherwise negative with the exception of those mentioned in the HPI and as above.    PHYSICAL EXAM: Filed Vitals:   05/05/15 1544  BP: 98/65  Pulse: 99   Temp: 100.4 F (38 C)  Resp: 17   Body mass index is 16.21 kg/(m^2).  General: Alert, no acute distress HEENT:  Normocephalic, atraumatic, oropharynx patent. 3+ redness with purulence on the right of the posterior oropharynx.  Eye: Juliette Mangle [facility] Cardiovascular:  Regular rate and rhythm, no rubs murmurs or gallops.  No Carotid bruits, radial pulse intact. No pedal edema.  Respiratory: Clear to auscultation bilaterally.  No wheezes, rales, or rhonchi.  No cyanosis, no use of accessory musculature Abdominal: No organomegaly, abdomen is soft and non-tender, positive bowel sounds.  No masses. Musculoskeletal: Gait intact. No edema, tenderness Skin: No rashes. Neurologic: Facial musculature symmetric. Psychiatric: Patient acts appropriately throughout our interaction. Lymphatic: She has swollen anterior and posterior cervical lymph nodes.   LABS: Results for orders placed or performed in visit on 05/03/15  Culture, Group A Strep  Result Value Ref Range   Organism ID, Bacteria Normal Upper Respiratory Flora    Organism ID, Bacteria No Beta Hemolytic Streptococci Isolated   POCT rapid strep A  Result Value Ref Range   Rapid Strep A Screen Negative Negative   Results for orders placed or performed in visit on 05/05/15  POCT CBC  Result Value Ref Range   WBC 10.8 (A) 4.6 - 10.2 K/uL   Lymph, poc 2.1 0.6 - 3.4   POC LYMPH PERCENT 19.2 10 - 50 %L   MID (cbc) 1.0 (A) 0 - 0.9   POC MID % 9.0 0 - 12 %M   POC Granulocyte 7.8 (A) 2 - 6.9   Granulocyte percent 71.8 37 - 80 %G   RBC 4.61 4.04 - 5.48 M/uL   Hemoglobin 13.6 12.2 - 16.2 g/dL   HCT, POC 41.2 37.7 - 47.9 %   MCV 89.4 80 - 97 fL   MCH, POC 29.5 27 - 31.2 pg   MCHC 33.0 31.8 - 35.4 g/dL   RDW, POC 12.5 %   Platelet Count, POC 192 142 - 424 K/uL   MPV 9.5 0 - 99.8 fL   EKG/XRAY:   Primary read interpreted by Dr. Everlene Farrier at [facility].  ASSESSMENT/PLAN: White count looks like a bacterial infection. I did send blood work for CMV and  EBV. We'll change from amoxicillin to Augmentin because of the purulence in the posterior pharynx.  Gross sideeffects, risk and benefits, and alternatives of medications d/w patient. Patient is aware that all medications have potential sideeffects and we are unable to predict every sideeffect or drug-drug interaction that may occur.    Arlyss Queen MD 05/05/2015 3:50 PM

## 2015-05-05 NOTE — Patient Instructions (Signed)
Sore Throat A sore throat is pain, burning, irritation, or scratchiness of the throat. There is often pain or tenderness when swallowing or talking. A sore throat may be accompanied by other symptoms, such as coughing, sneezing, fever, and swollen neck glands. A sore throat is often the first sign of another sickness, such as a cold, flu, strep throat, or mononucleosis (commonly known as mono). Most sore throats go away without medical treatment. CAUSES  The most common causes of a sore throat include:  A viral infection, such as a cold, flu, or mono.  A bacterial infection, such as strep throat, tonsillitis, or whooping cough.  Seasonal allergies.  Dryness in the air.  Irritants, such as smoke or pollution.  Gastroesophageal reflux disease (GERD). HOME CARE INSTRUCTIONS   Only take over-the-counter medicines as directed by your caregiver.  Drink enough fluids to keep your urine clear or pale yellow.  Rest as needed.  Try using throat sprays, lozenges, or sucking on hard candy to ease any pain (if older than 4 years or as directed).  Sip warm liquids, such as broth, herbal tea, or warm water with honey to relieve pain temporarily. You may also eat or drink cold or frozen liquids such as frozen ice pops.  Gargle with salt water (mix 1 tsp salt with 8 oz of water).  Do not smoke and avoid secondhand smoke.  Put a cool-mist humidifier in your bedroom at night to moisten the air. You can also turn on a hot shower and sit in the bathroom with the door closed for 5-10 minutes. SEEK IMMEDIATE MEDICAL CARE IF:  You have difficulty breathing.  You are unable to swallow fluids, soft foods, or your saliva.  You have increased swelling in the throat.  Your sore throat does not get better in 7 days.  You have nausea and vomiting.  You have a fever or persistent symptoms for more than 2-3 days.  You have a fever and your symptoms suddenly get worse. MAKE SURE YOU:   Understand  these instructions.  Will watch your condition.  Will get help right away if you are not doing well or get worse. Document Released: 11/05/2004 Document Revised: 09/14/2012 Document Reviewed: 06/05/2012 [facility] Patient Information 2015 [place], [place]. This information is not intended to replace advice given to you by your health care provider. Make sure you discuss any questions you have with your health care provider.

## 2015-05-06 LAB — COMPREHENSIVE METABOLIC PANEL
ALBUMIN: 4.3 g/dL (ref 3.6–5.1)
ALT: 10 U/L (ref 6–29)
AST: 16 U/L (ref 10–30)
Alkaline Phosphatase: 49 U/L (ref 33–115)
BILIRUBIN TOTAL: 0.3 mg/dL (ref 0.2–1.2)
BUN: 12 mg/dL (ref 7–25)
CO2: 28 mmol/L (ref 20–31)
CREATININE: 0.62 mg/dL (ref 0.50–1.10)
Calcium: 9.5 mg/dL (ref 8.6–10.2)
Chloride: 101 mmol/L (ref 98–110)
GLUCOSE: 89 mg/dL (ref 65–99)
POTASSIUM: 4.9 mmol/L (ref 3.5–5.3)
SODIUM: 139 mmol/L (ref 135–146)
Total Protein: 7.5 g/dL (ref 6.1–8.1)

## 2015-05-07 LAB — EPSTEIN-BARR VIRUS VCA ANTIBODY PANEL
EBV EA IgG: 44.9 U/mL — ABNORMAL HIGH (ref <9.0)
EBV NA IgG: 108 U/mL — ABNORMAL HIGH (ref <18.0)
EBV VCA IgG: 212 U/mL — ABNORMAL HIGH (ref <18.0)
EBV VCA IgM: <10 U/mL (ref <36.0)

## 2015-05-07 LAB — CMV IGM: CMV IgM: <8 AU/mL (ref <30.00)

## 2015-05-14 ENCOUNTER — Encounter (HOSPITAL_COMMUNITY): Payer: 59

## 2018-09-05 ENCOUNTER — Encounter (HOSPITAL_COMMUNITY): Payer: Self-pay | Admitting: Emergency Medicine

## 2018-09-05 ENCOUNTER — Ambulatory Visit (HOSPITAL_COMMUNITY)
Admission: EM | Admit: 2018-09-05 | Discharge: 2018-09-05 | Disposition: A | Discharge status: Home / Self Care | Payer: 59 | Attending: Family Medicine | Admitting: Family Medicine

## 2018-09-05 DIAGNOSIS — J020 Streptococcal pharyngitis: Secondary | ICD-10-CM

## 2018-09-05 LAB — POCT RAPID STREP A: STREPTOCOCCUS, GROUP A SCREEN (DIRECT): POSITIVE — AB

## 2018-09-05 MED ORDER — AMOXICILLIN 500 MG PO CAPS: 500.0000 mg | ORAL_CAPSULE | Freq: Two times a day (BID) | ORAL | 0 refills | Status: AC

## 2018-09-05 NOTE — ED Triage Notes (Signed)
Pt c/o sore throat and body aches since yesterday.

## 2018-09-05 NOTE — ED Provider Notes (Signed)
[facility]    CSN: [id number] Arrival date & time: 09/05/18  0901     History   Chief Complaint Chief Complaint  Patient presents with  . Sore Throat    HPI [name] Walker is a 42 y.o. female.    Sore Throat  This is a new problem. The current episode started yesterday. The problem occurs constantly. The problem has been gradually worsening. Associated symptoms include headaches. Pertinent negatives include no chest pain, no abdominal pain and no shortness of breath. Associated symptoms comments: She denies any associated URI symptoms or fever.. The symptoms are aggravated by drinking and swallowing. Nothing relieves the symptoms. Treatments tried: salt water gargles.  The treatment provided mild relief.    Past Medical History:  Diagnosis Date  . Abnormal Pap smear   . Anxiety   . Bicornate uterus   . Hx LEEP (loop electrosurgical excision procedure), cervix, pregnancy   . TMJ (temporomandibular joint syndrome)     Patient Active Problem List   Diagnosis Date Noted  . PVC (premature ventricular contraction) 12/10/2011    Past Surgical History:  Procedure Laterality Date  . CERVICAL CERCLAGE N/A 04/05/2014   Procedure: CERCLAGE CERVICAL;  Surgeon: Cyril Mourning, MD;  Location: [facility];  Service: Gynecology;  Laterality: N/A;  . CESAREAN SECTION  12/04/2011   Procedure: CESAREAN SECTION;  Surgeon: Cyril Mourning, MD;  Location: [facility];  Service: Gynecology;  Laterality: N/A;    OB History    Gravida  2   Para  2   Term  0   Preterm  2   AB  0   Living  2     SAB  0   TAB  0   Ectopic  0   Multiple  0   Live Births  2            Home Medications    Prior to Admission medications   Medication Sig Start Date End Date Taking? Authorizing Provider  acetaminophen (TYLENOL) 325 MG tablet Take 325 mg by mouth every 6 (six) hours as needed for mild pain.    [provider]  amoxicillin (AMOXIL) 500 MG capsule Take 1  capsule (500 mg total) by mouth 2 (two) times daily for 10 days. 09/05/18 09/15/18  Loura Halt A, NP  ibuprofen (ADVIL,MOTRIN) 200 MG tablet Take 200 mg by mouth every 6 (six) hours as needed for mild pain.    [provider]  Norethindrone (ORTHO MICRONOR PO) Take 1 tablet by mouth daily.    [provider]  Prenatal Vit-Fe Fumarate-FA (PRENATAL MULTIVITAMIN) TABS Take 1 tablet by mouth daily.    [provider]    Family History Family History  Problem Relation Age of Onset  . Arthritis Mother   . Cancer Mother   . Hyperlipidemia Mother   . Hypertension Mother   . Migraines Mother   . Breast cancer Mother 78  . Cancer Father   . Depression Brother   . Cancer Maternal Aunt   . Breast cancer Maternal Aunt   . Ovarian cancer Maternal Aunt   . Cancer Maternal Grandmother   . Hypertension Maternal Grandmother   . Leukemia Maternal Grandmother   . Cancer Maternal Grandfather   . Anesthesia problems Neg Hx   . Hypotension Neg Hx   . Malignant hyperthermia Neg Hx   . Pseudochol deficiency Neg Hx     Social History Social History   Tobacco Use  . Smoking status:  Former Smoker    Last attempt to quit: 10/12/1996    Years since quitting: 21.9  . Smokeless tobacco: Never Used  Substance Use Topics  . Alcohol use: No  . Drug use: No     Allergies   Sulfa antibiotics   Review of Systems Review of Systems  Respiratory: Negative for shortness of breath.   Cardiovascular: Negative for chest pain.  Gastrointestinal: Negative for abdominal pain.  Neurological: Positive for headaches.     Physical Exam Triage Vital Signs ED Triage Vitals [09/05/18 1012]  Enc Vitals Group     BP 121/81     Pulse Rate 94     Resp 16     Temp 98.2 F (36.8 C)     Temp src      SpO2 100 %     Weight      Height      Head Circumference      Peak Flow      Pain Score 3     Pain Loc      Pain Edu?      Excl. in [place]?    No data found.  Updated Vital  Signs BP 121/81   Pulse 94   Temp 98.2 F (36.8 C)   Resp 16   SpO2 100%   Visual Acuity Right Eye Distance:   Left Eye Distance:   Bilateral Distance:    Right Eye Near:   Left Eye Near:    Bilateral Near:     Physical Exam  Constitutional: She appears well-developed and well-nourished.  Non-toxic appearance. She does not appear ill.  HENT:  Head: Normocephalic.  Right Ear: Hearing, tympanic membrane and ear canal normal.  Left Ear: Hearing, tympanic membrane and ear canal normal.  Mouth/Throat: Posterior oropharyngeal erythema present. Tonsils are 1+ on the right. Tonsils are 1+ on the left. No tonsillar exudate.  Neck: Normal range of motion.  Pulmonary/Chest: Effort normal.  Lymphadenopathy:    She has no cervical adenopathy.  Neurological: She is alert.  Skin: Skin is warm and dry.  Psychiatric: She has a normal mood and affect.  Nursing note and vitals reviewed.    UC Treatments / Results  Labs (all labs ordered are listed, but only abnormal results are displayed) Labs Reviewed  POCT RAPID STREP A - Abnormal; Notable for the following components:      Result Value   Streptococcus, Group A Screen (Direct) POSITIVE (*)    All other components within normal limits    EKG None  Radiology No results found.  Procedures Procedures (including critical care time)  Medications Ordered in UC Medications - No data to display  Initial Impression / Assessment and Plan / UC Course  I have reviewed the triage vital signs and the nursing notes.  Pertinent labs & imaging results that were available during my care of the patient were reviewed by me and considered in my medical decision making (see chart for details).     Rapid strep test positive We will treat with amoxicillin twice a day for 10 days Warm salt water gargles or Chloraseptic spray for symptoms Follow up as needed for continued or worsening symptoms'  Final Clinical Impressions(s) / UC Diagnoses    Final diagnoses:  Strep pharyngitis     Discharge Instructions     Your strep test here was positive We will treat this with amoxicillin twice a day for 10 days Continue the salt water gargles and you can use Chloraseptic  spray to help with sore throat Follow up as needed for continued or worsening symptoms     ED Prescriptions    Medication Sig Dispense Auth. Provider   amoxicillin (AMOXIL) 500 MG capsule Take 1 capsule (500 mg total) by mouth 2 (two) times daily for 10 days. 20 capsule Loura Halt A, NP     Controlled Substance Prescriptions [place] Controlled Substance Registry consulted? Not Applicable   Orvan July, NP 09/05/18 1133

## 2018-09-05 NOTE — Discharge Instructions (Addendum)
Your strep test here was positive We will treat this with amoxicillin twice a day for 10 days Continue the salt water gargles and you can use Chloraseptic spray to help with sore throat Follow up as needed for continued or worsening symptoms

## 2020-02-21 DIAGNOSIS — Z0000 Encounter for general adult medical examination without abnormal findings: Secondary | ICD-10-CM | POA: Diagnosis not present

## 2020-02-21 DIAGNOSIS — Z13220 Encounter for screening for lipoid disorders: Secondary | ICD-10-CM | POA: Diagnosis not present

## 2020-02-28 DIAGNOSIS — Z803 Family history of malignant neoplasm of breast: Secondary | ICD-10-CM | POA: Diagnosis not present

## 2020-02-28 DIAGNOSIS — Z681 Body mass index (BMI) 19 or less, adult: Secondary | ICD-10-CM | POA: Diagnosis not present

## 2020-02-28 DIAGNOSIS — E875 Hyperkalemia: Secondary | ICD-10-CM | POA: Diagnosis not present

## 2020-02-28 DIAGNOSIS — Z1231 Encounter for screening mammogram for malignant neoplasm of breast: Secondary | ICD-10-CM | POA: Diagnosis not present

## 2020-02-28 DIAGNOSIS — O34219 Maternal care for unspecified type scar from previous cesarean delivery: Secondary | ICD-10-CM | POA: Diagnosis not present

## 2020-02-28 DIAGNOSIS — Z882 Allergy status to sulfonamides status: Secondary | ICD-10-CM | POA: Diagnosis not present

## 2020-02-28 DIAGNOSIS — Z01419 Encounter for gynecological examination (general) (routine) without abnormal findings: Secondary | ICD-10-CM | POA: Diagnosis not present

## 2020-02-28 DIAGNOSIS — Z0000 Encounter for general adult medical examination without abnormal findings: Secondary | ICD-10-CM | POA: Diagnosis not present

## 2020-02-28 DIAGNOSIS — G43009 Migraine without aura, not intractable, without status migrainosus: Secondary | ICD-10-CM | POA: Diagnosis not present

## 2020-09-25 DIAGNOSIS — D2271 Melanocytic nevi of right lower limb, including hip: Secondary | ICD-10-CM | POA: Diagnosis not present

## 2020-09-25 DIAGNOSIS — D485 Neoplasm of uncertain behavior of skin: Secondary | ICD-10-CM | POA: Diagnosis not present

## 2020-09-25 DIAGNOSIS — L905 Scar conditions and fibrosis of skin: Secondary | ICD-10-CM | POA: Diagnosis not present

## 2020-09-25 DIAGNOSIS — D2272 Melanocytic nevi of left lower limb, including hip: Secondary | ICD-10-CM | POA: Diagnosis not present

## 2020-09-25 DIAGNOSIS — D225 Melanocytic nevi of trunk: Secondary | ICD-10-CM | POA: Diagnosis not present

## 2020-09-25 DIAGNOSIS — D2262 Melanocytic nevi of left upper limb, including shoulder: Secondary | ICD-10-CM | POA: Diagnosis not present

## 2020-09-26 DIAGNOSIS — D225 Melanocytic nevi of trunk: Secondary | ICD-10-CM | POA: Diagnosis not present

## 2020-09-26 DIAGNOSIS — D2262 Melanocytic nevi of left upper limb, including shoulder: Secondary | ICD-10-CM | POA: Diagnosis not present

## 2020-09-26 DIAGNOSIS — D2271 Melanocytic nevi of right lower limb, including hip: Secondary | ICD-10-CM | POA: Diagnosis not present

## 2020-09-26 DIAGNOSIS — D2272 Melanocytic nevi of left lower limb, including hip: Secondary | ICD-10-CM | POA: Diagnosis not present

## 2021-02-26 DIAGNOSIS — Z0000 Encounter for general adult medical examination without abnormal findings: Secondary | ICD-10-CM | POA: Diagnosis not present

## 2021-03-03 DIAGNOSIS — Z0000 Encounter for general adult medical examination without abnormal findings: Secondary | ICD-10-CM | POA: Diagnosis not present

## 2021-03-03 DIAGNOSIS — F411 Generalized anxiety disorder: Secondary | ICD-10-CM | POA: Diagnosis not present

## 2021-03-03 DIAGNOSIS — G43009 Migraine without aura, not intractable, without status migrainosus: Secondary | ICD-10-CM | POA: Diagnosis not present

## 2021-03-11 DIAGNOSIS — Z1211 Encounter for screening for malignant neoplasm of colon: Secondary | ICD-10-CM | POA: Diagnosis not present

## 2021-03-11 DIAGNOSIS — Z1212 Encounter for screening for malignant neoplasm of rectum: Secondary | ICD-10-CM | POA: Diagnosis not present

## 2021-03-12 DIAGNOSIS — Z681 Body mass index (BMI) 19 or less, adult: Secondary | ICD-10-CM | POA: Diagnosis not present

## 2021-03-12 DIAGNOSIS — Z1231 Encounter for screening mammogram for malignant neoplasm of breast: Secondary | ICD-10-CM | POA: Diagnosis not present

## 2021-03-12 DIAGNOSIS — Z01419 Encounter for gynecological examination (general) (routine) without abnormal findings: Secondary | ICD-10-CM | POA: Diagnosis not present

## 2021-03-16 LAB — COLOGUARD: COLOGUARD: NEGATIVE

## 2021-05-05 DIAGNOSIS — K6289 Other specified diseases of anus and rectum: Secondary | ICD-10-CM | POA: Diagnosis not present

## 2021-10-17 DIAGNOSIS — Z03818 Encounter for observation for suspected exposure to other biological agents ruled out: Secondary | ICD-10-CM | POA: Diagnosis not present

## 2021-10-17 DIAGNOSIS — J020 Streptococcal pharyngitis: Secondary | ICD-10-CM | POA: Diagnosis not present

## 2021-10-17 DIAGNOSIS — J029 Acute pharyngitis, unspecified: Secondary | ICD-10-CM | POA: Diagnosis not present

## 2021-10-17 DIAGNOSIS — Z20822 Contact with and (suspected) exposure to covid-19: Secondary | ICD-10-CM | POA: Diagnosis not present

## 2021-12-03 DIAGNOSIS — L239 Allergic contact dermatitis, unspecified cause: Secondary | ICD-10-CM | POA: Diagnosis not present

## 2022-01-14 DIAGNOSIS — D225 Melanocytic nevi of trunk: Secondary | ICD-10-CM | POA: Diagnosis not present

## 2022-01-14 DIAGNOSIS — D2261 Melanocytic nevi of right upper limb, including shoulder: Secondary | ICD-10-CM | POA: Diagnosis not present

## 2022-01-14 DIAGNOSIS — D2262 Melanocytic nevi of left upper limb, including shoulder: Secondary | ICD-10-CM | POA: Diagnosis not present

## 2022-01-14 DIAGNOSIS — D2271 Melanocytic nevi of right lower limb, including hip: Secondary | ICD-10-CM | POA: Diagnosis not present

## 2022-03-03 DIAGNOSIS — Z0000 Encounter for general adult medical examination without abnormal findings: Secondary | ICD-10-CM | POA: Diagnosis not present

## 2022-03-10 ENCOUNTER — Other Ambulatory Visit: Payer: Self-pay | Admitting: Internal Medicine

## 2022-03-10 DIAGNOSIS — Z803 Family history of malignant neoplasm of breast: Secondary | ICD-10-CM | POA: Diagnosis not present

## 2022-03-10 DIAGNOSIS — F411 Generalized anxiety disorder: Secondary | ICD-10-CM | POA: Diagnosis not present

## 2022-03-10 DIAGNOSIS — R636 Underweight: Secondary | ICD-10-CM | POA: Diagnosis not present

## 2022-03-10 DIAGNOSIS — Z0000 Encounter for general adult medical examination without abnormal findings: Secondary | ICD-10-CM | POA: Diagnosis not present

## 2022-03-16 ENCOUNTER — Other Ambulatory Visit: Payer: Self-pay | Admitting: Internal Medicine

## 2022-03-16 DIAGNOSIS — E7800 Pure hypercholesterolemia, unspecified: Secondary | ICD-10-CM

## 2022-05-12 ENCOUNTER — Ambulatory Visit
Admission: RE | Admit: 2022-05-12 | Discharge: 2022-05-12 | Disposition: A | Discharge status: Home / Self Care | Payer: No Typology Code available for payment source | Source: Ambulatory Visit | Attending: Internal Medicine | Admitting: Internal Medicine

## 2022-05-12 DIAGNOSIS — E7800 Pure hypercholesterolemia, unspecified: Secondary | ICD-10-CM

## 2022-05-14 DIAGNOSIS — E7800 Pure hypercholesterolemia, unspecified: Secondary | ICD-10-CM | POA: Diagnosis not present

## 2022-06-08 DIAGNOSIS — Z681 Body mass index (BMI) 19 or less, adult: Secondary | ICD-10-CM | POA: Diagnosis not present

## 2022-06-08 DIAGNOSIS — Z1231 Encounter for screening mammogram for malignant neoplasm of breast: Secondary | ICD-10-CM | POA: Diagnosis not present

## 2022-06-08 DIAGNOSIS — Z01419 Encounter for gynecological examination (general) (routine) without abnormal findings: Secondary | ICD-10-CM | POA: Diagnosis not present

## 2022-06-08 DIAGNOSIS — Z124 Encounter for screening for malignant neoplasm of cervix: Secondary | ICD-10-CM | POA: Diagnosis not present

## 2023-01-11 DIAGNOSIS — D225 Melanocytic nevi of trunk: Secondary | ICD-10-CM | POA: Diagnosis not present

## 2023-01-11 DIAGNOSIS — D485 Neoplasm of uncertain behavior of skin: Secondary | ICD-10-CM | POA: Diagnosis not present

## 2023-01-11 DIAGNOSIS — L821 Other seborrheic keratosis: Secondary | ICD-10-CM | POA: Diagnosis not present

## 2023-01-11 DIAGNOSIS — D2262 Melanocytic nevi of left upper limb, including shoulder: Secondary | ICD-10-CM | POA: Diagnosis not present

## 2023-01-11 DIAGNOSIS — D2239 Melanocytic nevi of other parts of face: Secondary | ICD-10-CM | POA: Diagnosis not present

## 2023-01-11 DIAGNOSIS — D220 Melanocytic nevi of lip: Secondary | ICD-10-CM | POA: Diagnosis not present

## 2023-04-20 DIAGNOSIS — Z0000 Encounter for general adult medical examination without abnormal findings: Secondary | ICD-10-CM | POA: Diagnosis not present

## 2023-04-27 DIAGNOSIS — Z0000 Encounter for general adult medical examination without abnormal findings: Secondary | ICD-10-CM | POA: Diagnosis not present

## 2023-04-27 DIAGNOSIS — G43009 Migraine without aura, not intractable, without status migrainosus: Secondary | ICD-10-CM | POA: Diagnosis not present

## 2023-04-27 DIAGNOSIS — F411 Generalized anxiety disorder: Secondary | ICD-10-CM | POA: Diagnosis not present

## 2023-07-07 DIAGNOSIS — Z01419 Encounter for gynecological examination (general) (routine) without abnormal findings: Secondary | ICD-10-CM | POA: Diagnosis not present

## 2023-07-07 DIAGNOSIS — Z1231 Encounter for screening mammogram for malignant neoplasm of breast: Secondary | ICD-10-CM | POA: Diagnosis not present

## 2023-07-07 DIAGNOSIS — Z681 Body mass index (BMI) 19 or less, adult: Secondary | ICD-10-CM | POA: Diagnosis not present

## 2023-07-07 DIAGNOSIS — Z124 Encounter for screening for malignant neoplasm of cervix: Secondary | ICD-10-CM | POA: Diagnosis not present

## 2023-08-24 DIAGNOSIS — F902 Attention-deficit hyperactivity disorder, combined type: Secondary | ICD-10-CM | POA: Diagnosis not present

## 2024-01-09 DIAGNOSIS — J029 Acute pharyngitis, unspecified: Secondary | ICD-10-CM | POA: Diagnosis not present

## 2024-01-09 DIAGNOSIS — R059 Cough, unspecified: Secondary | ICD-10-CM | POA: Diagnosis not present

## 2024-01-11 DIAGNOSIS — F411 Generalized anxiety disorder: Secondary | ICD-10-CM | POA: Diagnosis not present

## 2024-01-21 DIAGNOSIS — F411 Generalized anxiety disorder: Secondary | ICD-10-CM | POA: Diagnosis not present

## 2024-01-25 DIAGNOSIS — F411 Generalized anxiety disorder: Secondary | ICD-10-CM | POA: Diagnosis not present

## 2024-02-23 DIAGNOSIS — F411 Generalized anxiety disorder: Secondary | ICD-10-CM | POA: Diagnosis not present

## 2024-04-19 DIAGNOSIS — F411 Generalized anxiety disorder: Secondary | ICD-10-CM | POA: Diagnosis not present

## 2024-04-26 DIAGNOSIS — Z0000 Encounter for general adult medical examination without abnormal findings: Secondary | ICD-10-CM | POA: Diagnosis not present

## 2024-06-21 DIAGNOSIS — Z0000 Encounter for general adult medical examination without abnormal findings: Secondary | ICD-10-CM | POA: Diagnosis not present

## 2024-06-21 DIAGNOSIS — Z23 Encounter for immunization: Secondary | ICD-10-CM | POA: Diagnosis not present

## 2024-06-21 DIAGNOSIS — F411 Generalized anxiety disorder: Secondary | ICD-10-CM | POA: Diagnosis not present

## 2024-06-21 DIAGNOSIS — G43009 Migraine without aura, not intractable, without status migrainosus: Secondary | ICD-10-CM | POA: Diagnosis not present

## 2024-07-11 DIAGNOSIS — Z1231 Encounter for screening mammogram for malignant neoplasm of breast: Secondary | ICD-10-CM | POA: Diagnosis not present

## 2024-07-11 DIAGNOSIS — Z01419 Encounter for gynecological examination (general) (routine) without abnormal findings: Secondary | ICD-10-CM | POA: Diagnosis not present

## 2024-07-11 DIAGNOSIS — Z124 Encounter for screening for malignant neoplasm of cervix: Secondary | ICD-10-CM | POA: Diagnosis not present

## 2024-07-11 DIAGNOSIS — Z681 Body mass index (BMI) 19 or less, adult: Secondary | ICD-10-CM | POA: Diagnosis not present

## 2024-09-19 DIAGNOSIS — B029 Zoster without complications: Secondary | ICD-10-CM | POA: Diagnosis not present

## 2024-10-27 LAB — COLOGUARD: COLOGUARD: NEGATIVE
